# Patient Record
Sex: Female | Born: 1975 | Race: Black or African American | Hispanic: No | Marital: Single | Smoking: Never smoker
Health system: Southern US, Community
[De-identification: ages and names within clinical notes are randomized; demographics above are authoritative.]

## PROBLEM LIST (undated history)

## (undated) DIAGNOSIS — E785 Hyperlipidemia, unspecified: Secondary | ICD-10-CM

## (undated) DIAGNOSIS — R87619 Unspecified abnormal cytological findings in specimens from cervix uteri: Secondary | ICD-10-CM

## (undated) DIAGNOSIS — D649 Anemia, unspecified: Secondary | ICD-10-CM

## (undated) DIAGNOSIS — N921 Excessive and frequent menstruation with irregular cycle: Secondary | ICD-10-CM

## (undated) DIAGNOSIS — M1990 Unspecified osteoarthritis, unspecified site: Secondary | ICD-10-CM

## (undated) DIAGNOSIS — I10 Essential (primary) hypertension: Secondary | ICD-10-CM

## (undated) HISTORY — PX: DILATION AND CURETTAGE OF UTERUS: SHX78

## (undated) HISTORY — DX: Hyperlipidemia, unspecified: E78.5

## (undated) HISTORY — PX: FOOT SURGERY: SHX648

## (undated) HISTORY — DX: Unspecified abnormal cytological findings in specimens from cervix uteri: R87.619

## (undated) HISTORY — DX: Unspecified osteoarthritis, unspecified site: M19.90

## (undated) HISTORY — PX: ABDOMINAL HYSTERECTOMY: SHX81

## (undated) HISTORY — DX: Essential (primary) hypertension: I10

---
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
## 2000-08-08 ENCOUNTER — Encounter: Payer: Self-pay | Admitting: Emergency Medicine

## 2000-08-09 ENCOUNTER — Ambulatory Visit (HOSPITAL_COMMUNITY): Admission: RE | Admit: 2000-08-09 | Discharge: 2000-08-09 | Payer: Self-pay | Admitting: Obstetrics and Gynecology

## 2001-04-16 ENCOUNTER — Other Ambulatory Visit: Admission: RE | Admit: 2001-04-16 | Discharge: 2001-04-16 | Payer: Self-pay | Admitting: Gynecology

## 2001-11-07 ENCOUNTER — Inpatient Hospital Stay (HOSPITAL_COMMUNITY): Admission: AD | Admit: 2001-11-07 | Discharge: 2001-11-09 | Payer: Self-pay | Admitting: Gynecology

## 2001-12-24 ENCOUNTER — Other Ambulatory Visit: Admission: RE | Admit: 2001-12-24 | Discharge: 2001-12-24 | Payer: Self-pay | Admitting: Gynecology

## 2003-01-07 ENCOUNTER — Other Ambulatory Visit: Admission: RE | Admit: 2003-01-07 | Discharge: 2003-01-07 | Payer: Self-pay | Admitting: Gynecology

## 2010-04-13 DIAGNOSIS — R87619 Unspecified abnormal cytological findings in specimens from cervix uteri: Secondary | ICD-10-CM

## 2010-04-13 HISTORY — DX: Unspecified abnormal cytological findings in specimens from cervix uteri: R87.619

## 2010-05-14 HISTORY — PX: COLPOSCOPY: SHX161

## 2013-05-01 ENCOUNTER — Telehealth: Payer: Self-pay | Admitting: Certified Nurse Midwife

## 2013-05-01 NOTE — Telephone Encounter (Signed)
Patient dropped of a proof of aex for insurance. Needs by 05/11/13

## 2013-05-04 ENCOUNTER — Telehealth: Payer: Self-pay

## 2013-05-04 NOTE — Telephone Encounter (Signed)
Called patient to let her know that her medical form was filled out. Pt told me that i could fax the form & we can scan the original in her chart. I told pt we would do that.

## 2013-05-28 ENCOUNTER — Encounter: Payer: Self-pay | Admitting: Certified Nurse Midwife

## 2013-06-01 ENCOUNTER — Ambulatory Visit (INDEPENDENT_AMBULATORY_CARE_PROVIDER_SITE_OTHER): Payer: 59 | Admitting: Certified Nurse Midwife

## 2013-06-01 ENCOUNTER — Encounter: Payer: Self-pay | Admitting: Certified Nurse Midwife

## 2013-06-01 VITALS — BP 122/74 | HR 74 | Resp 16 | Ht 64.75 in | Wt 164.0 lb

## 2013-06-01 DIAGNOSIS — Z0000 Encounter for general adult medical examination without abnormal findings: Secondary | ICD-10-CM

## 2013-06-01 DIAGNOSIS — Z01419 Encounter for gynecological examination (general) (routine) without abnormal findings: Secondary | ICD-10-CM

## 2013-06-01 LAB — POCT URINALYSIS DIPSTICK
Bilirubin, UA: NEGATIVE
Blood, UA: NEGATIVE
Glucose, UA: NEGATIVE
Ketones, UA: NEGATIVE
Leukocytes, UA: NEGATIVE
Nitrite, UA: NEGATIVE
Protein, UA: NEGATIVE
Urobilinogen, UA: NEGATIVE
pH, UA: 5

## 2013-06-01 LAB — HEMOGLOBIN, FINGERSTICK: Hemoglobin, fingerstick: 12.1 g/dL (ref 12.0–16.0)

## 2013-06-01 NOTE — Progress Notes (Signed)
Reviewed personally.  M. Suzanne Rumor Sun, MD.  

## 2013-06-01 NOTE — Progress Notes (Signed)
38 y.o. [id number] Single African American Fe here for annual exam. Periods normal, no issues. Contraception working well. No partner change or STD screening needed. Sees PCP prn,no health issues in past year or today.  Patient's last menstrual period was 05/08/2013.          Sexually active: yes  The current method of family planning is condoms most of the time.    Exercising: some  exercise Smoker:  no  Health Maintenance: Pap:  05-28-12 neg HPV HR neg MMG:  05-21-11 neg & left breast u/s Colonoscopy:  none BMD:   none TDaP: 47yrs ago? Labs: Poct urine-neg, Hgb-12.1 Self breast exam: done monthly   reports that she has never smoked. She does not have any smokeless tobacco history on file. She reports that she does not drink alcohol or use illicit drugs.  Past Medical History  Diagnosis Date  . Abnormal Pap smear of cervix 12/11    LGSIL    Past Surgical History  Procedure Laterality Date  . Colposcopy  1/12    mild dysplasia CIN1, HPV effect  . Dilation and curettage of uterus      Current Outpatient Prescriptions  Medication Sig Dispense Refill  . Multiple Vitamins-Minerals (MULTIVITAMIN PO) Take by mouth daily.       No current facility-administered medications for this visit.    Family History  Problem Relation Age of Onset  . Thyroid disease Mother   . Hypertension Mother   . Diabetes Mother   . Sickle cell anemia Sister   . Deep vein thrombosis Sister     ROS:  Pertinent items are noted in HPI.  Otherwise, a comprehensive ROS was negative.  Exam:   BP 122/74  Pulse 74  Resp 16  Ht 5' 4.75" (1.645 m)  Wt 164 lb (74.39 kg)  BMI 27.49 kg/m2  LMP 05/08/2013 Height: 5' 4.75" (164.5 cm)  Ht Readings from Last 3 Encounters:  06/01/13 5' 4.75" (1.645 m)    General appearance: alert, cooperative and appears stated age Head: Normocephalic, without obvious abnormality, atraumatic Neck: no adenopathy, supple, symmetrical, trachea midline and thyroid normal to  inspection and palpation Lungs: clear to auscultation bilaterally Breasts: normal appearance, no masses or tenderness Heart: regular rate and rhythm Abdomen: soft, non-tender; no masses,  no organomegaly Extremities: extremities normal, atraumatic, no cyanosis or edema Skin: Skin color, texture, turgor normal. No rashes or lesions Lymph nodes: Cervical, supraclavicular, and axillary nodes normal. No abnormal inguinal nodes palpated Neurologic: Grossly normal   Pelvic: External genitalia:  no lesions              Urethra:  normal appearing urethra with no masses, tenderness or lesions              Bartholin's and Skene's: normal                 Vagina: normal appearing vagina with normal color and discharge, no lesions              Cervix: non tender, normal appearance              Pap taken: no Bimanual Exam:  Uterus:  normal size, contour, position, consistency, mobility, non-tender and anteflexed              Adnexa: normal adnexa and no mass, fullness, tenderness               Rectovaginal: Confirms  Anus:  normal sphincter tone, no lesions  A:  Well Woman with normal exam  Contraception condoms  Fasting labs  History of LSIL, colpo with CIN 1 diagnosis 2012 pap smear negative past 2 years with HPVHR not detected  Immunization update  P:   Reviewed health and wellness pertinent to exam  Labs: Lipid panel, TSH, Hgb A1-c  Declines today, not sure due  Pap smear as per guidelines   Mammogram at 39 pap smear not taken today counseled on breast self exam, mammography screening, STD prevention, HIV risk factors and prevention, adequate intake of calcium and vitamin D, diet and exercise  return annually or prn  An After Visit Summary was printed and given to the patient.

## 2013-06-01 NOTE — Patient Instructions (Signed)

## 2013-06-02 LAB — LIPID PANEL
Cholesterol: 189 mg/dL (ref 0–200)
HDL: 37 mg/dL — ABNORMAL LOW (ref >39)
LDL Cholesterol: 132 mg/dL — ABNORMAL HIGH (ref 0–99)
Total CHOL/HDL Ratio: 5.1 Ratio
Triglycerides: 99 mg/dL (ref <150)
VLDL: 20 mg/dL (ref 0–40)

## 2013-06-02 LAB — HEMOGLOBIN A1C
Hgb A1c MFr Bld: 5.3 % (ref <5.7)
Mean Plasma Glucose: 105 mg/dL (ref <117)

## 2013-06-02 LAB — TSH: TSH: 1.737 u[IU]/mL (ref 0.350–4.500)

## 2014-03-15 ENCOUNTER — Encounter: Payer: Self-pay | Admitting: Certified Nurse Midwife

## 2014-06-02 ENCOUNTER — Encounter: Payer: Self-pay | Admitting: Certified Nurse Midwife

## 2014-06-02 ENCOUNTER — Ambulatory Visit (INDEPENDENT_AMBULATORY_CARE_PROVIDER_SITE_OTHER): Payer: 59 | Admitting: Certified Nurse Midwife

## 2014-06-02 VITALS — BP 122/72 | HR 70 | Resp 16 | Ht 64.75 in | Wt 163.0 lb

## 2014-06-02 DIAGNOSIS — Z124 Encounter for screening for malignant neoplasm of cervix: Secondary | ICD-10-CM

## 2014-06-02 DIAGNOSIS — R899 Unspecified abnormal finding in specimens from other organs, systems and tissues: Secondary | ICD-10-CM

## 2014-06-02 DIAGNOSIS — Z0000 Encounter for general adult medical examination without abnormal findings: Secondary | ICD-10-CM

## 2014-06-02 DIAGNOSIS — Z01419 Encounter for gynecological examination (general) (routine) without abnormal findings: Secondary | ICD-10-CM | POA: Diagnosis not present

## 2014-06-02 DIAGNOSIS — N949 Unspecified condition associated with female genital organs and menstrual cycle: Secondary | ICD-10-CM | POA: Diagnosis not present

## 2014-06-02 DIAGNOSIS — Z23 Encounter for immunization: Secondary | ICD-10-CM

## 2014-06-02 DIAGNOSIS — N9489 Other specified conditions associated with female genital organs and menstrual cycle: Secondary | ICD-10-CM

## 2014-06-02 DIAGNOSIS — N852 Hypertrophy of uterus: Secondary | ICD-10-CM | POA: Diagnosis not present

## 2014-06-02 LAB — COMPREHENSIVE METABOLIC PANEL
ALBUMIN: 4.1 g/dL (ref 3.5–5.2)
ALT: 11 U/L (ref 0–35)
AST: 14 U/L (ref 0–37)
Alkaline Phosphatase: 34 U/L — ABNORMAL LOW (ref 39–117)
BUN: 12 mg/dL (ref 6–23)
CHLORIDE: 104 meq/L (ref 96–112)
CO2: 28 meq/L (ref 19–32)
Calcium: 9.4 mg/dL (ref 8.4–10.5)
Creat: 0.82 mg/dL (ref 0.50–1.10)
GLUCOSE: 94 mg/dL (ref 70–99)
POTASSIUM: 4.4 meq/L (ref 3.5–5.3)
SODIUM: 138 meq/L (ref 135–145)
TOTAL PROTEIN: 7.6 g/dL (ref 6.0–8.3)
Total Bilirubin: 1.6 mg/dL — ABNORMAL HIGH (ref 0.2–1.2)

## 2014-06-02 LAB — LIPID PANEL
Cholesterol: 194 mg/dL (ref 0–200)
HDL: 41 mg/dL (ref >39)
LDL Cholesterol: 132 mg/dL — ABNORMAL HIGH (ref 0–99)
Total CHOL/HDL Ratio: 4.7 Ratio
Triglycerides: 104 mg/dL (ref <150)
VLDL: 21 mg/dL (ref 0–40)

## 2014-06-02 LAB — POCT URINALYSIS DIPSTICK
Bilirubin, UA: NEGATIVE
Blood, UA: NEGATIVE
Glucose, UA: NEGATIVE
Ketones, UA: NEGATIVE
Leukocytes, UA: NEGATIVE
Nitrite, UA: NEGATIVE
Protein, UA: NEGATIVE
Urobilinogen, UA: NEGATIVE
pH, UA: 5

## 2014-06-02 LAB — HEMOGLOBIN, FINGERSTICK: Hemoglobin, fingerstick: 11.7 g/dL — ABNORMAL LOW (ref 12.0–16.0)

## 2014-06-02 LAB — HEMOGLOBIN A1C
Hgb A1c MFr Bld: 5.5 % (ref <5.7)
Mean Plasma Glucose: 111 mg/dL (ref <117)

## 2014-06-02 NOTE — Progress Notes (Signed)
39 y.o. [id number] Single  African American Fe here for annual exam. Periods normal, no missed periods. No partner change. No STD screening needed. Mother passed in 11/15. Emotionally coping well. Good family support. Sees urgent care if needed. Would like fasting labs today for insurance. No other health issues.  Patient's last menstrual period was 05/11/2014.          Sexually active: Yes.    The current method of family planning is condoms all the time.    Exercising: No.  exercise Smoker:  no  Health Maintenance: Pap:  05-28-12 neg HPV HR neg MMG:  05-21-11 neg & left breast u/s Colonoscopy:  none BMD:   none TDaP:  unsure Labs: Poct urine-neg, Hgb-11.7 Self breast exam: done occ   reports that she has never smoked. She does not have any smokeless tobacco history on file. She reports that she does not drink alcohol or use illicit drugs.  Past Medical History  Diagnosis Date  . Abnormal Pap smear of cervix 12/11    LGSIL    Past Surgical History  Procedure Laterality Date  . Colposcopy  1/12    mild dysplasia CIN1, HPV effect  . Dilation and curettage of uterus      Current Outpatient Prescriptions  Medication Sig Dispense Refill  . Multiple Vitamins-Minerals (MULTIVITAMIN PO) Take by mouth daily.     No current facility-administered medications for this visit.    Family History  Problem Relation Age of Onset  . Thyroid disease Mother   . Hypertension Mother   . Diabetes Mother   . Sickle cell anemia Sister   . Deep vein thrombosis Sister     ROS:  Pertinent items are noted in HPI.  Otherwise, a comprehensive ROS was negative.  Exam:   BP 122/72 mmHg  Pulse 70  Resp 16  Ht 5' 4.75" (1.645 m)  Wt 163 lb (73.936 kg)  BMI 27.32 kg/m2  LMP 05/11/2014 Height: 5' 4.75" (164.5 cm) Ht Readings from Last 3 Encounters:  06/02/14 5' 4.75" (1.645 m)  06/01/13 5' 4.75" (1.645 m)    General appearance: alert, cooperative and appears stated age Head: Normocephalic,  without obvious abnormality, atraumatic Neck: no adenopathy, supple, symmetrical, trachea midline and thyroid normal to inspection and palpation Lungs: clear to auscultation bilaterally Breasts: normal appearance, no masses or tenderness, No nipple retraction or dimpling, No nipple discharge or bleeding, No axillary or supraclavicular adenopathy Heart: regular rate and rhythm Abdomen: soft, non-tender; no masses,  no organomegaly Extremities: extremities normal, atraumatic, no cyanosis or edema Skin: Skin color, texture, turgor normal. No rashes or lesions Lymph nodes: Cervical, supraclavicular, and axillary nodes normal. No abnormal inguinal nodes palpated Neurologic: Grossly normal   Pelvic: External genitalia:  no lesions              Urethra:  normal appearing urethra with no masses, tenderness or lesions              Bartholin's and Skene's: normal                 Vagina: normal appearing vagina with normal color and discharge, no lesions              Cervix: normal, non tender, no lesions              Pap taken: No. Bimanual Exam:  Uterus:  enlarged, 12-14  weeks size and firm              Adnexa:  normal adnexa and no mass, fullness, tenderness on right, left not palpable or ? Adnexal mass due to fullness in that area               Rectovaginal: Confirms               Anus:  normal sphincter tone, no lesions    A:  Well Woman with normal exam  Contraception condoms  Enlarged uterus ? Left adnexal mass  Screening labs  Immunization update  P:   Reviewed health and wellness pertinent to exam  Discussed finding and need for evaluation with PUS. Discussed possible etiology of fibroid or adenomyosis or mass. Patient instructed she will be called with insurance information and then scheduled. Questions addressed at length.  Labs: Lipid panel, CMP, Hgb A1-c  Requests TDAP  Pap smear not taken today  counseled on breast self exam, mammography screening to schedule at age 75, STD  prevention, HIV risk factors and prevention, adequate intake of calcium and vitamin D, diet and exercise  return annually or prn    15 minutes spent with patient in face to face counseling regarding enlarged uterus and ? Left adnexal mass.   An After Visit Summary was printed and given to the patient.

## 2014-06-02 NOTE — Patient Instructions (Signed)

## 2014-06-06 NOTE — Progress Notes (Signed)
Reviewed personally.  M. Suzanne Furqan Gosselin, MD.  

## 2014-06-07 ENCOUNTER — Telehealth: Payer: Self-pay | Admitting: Certified Nurse Midwife

## 2014-06-07 NOTE — Telephone Encounter (Signed)
Call to patient to schedule PUS.  Advised of [place] $478.77. Patient in the mall. Will call back to discuss scheduling.

## 2014-06-08 NOTE — Telephone Encounter (Signed)
Patient call to speak with [place]

## 2014-06-09 ENCOUNTER — Other Ambulatory Visit: Payer: Self-pay

## 2014-06-09 DIAGNOSIS — N852 Hypertrophy of uterus: Secondary | ICD-10-CM

## 2014-06-09 LAB — IPS PAP TEST WITH REFLEX TO HPV

## 2014-06-09 NOTE — Telephone Encounter (Signed)
Call to patient. Scheduled PUS.  Reiterated PR $478.77.Marland KitchenMarland Kitchenpatient agreeable. Advised patient of 72 hour cancellation policy and $034 cancellation fee. Patient agreeable.

## 2014-06-16 ENCOUNTER — Other Ambulatory Visit: Payer: 59

## 2014-06-16 LAB — BILIRUBIN, TOTAL: BILIRUBIN TOTAL: 1.8 mg/dL — AB (ref 0.2–1.2)

## 2014-06-16 LAB — HEPATITIS C ANTIBODY: HCV AB: NEGATIVE

## 2014-06-16 LAB — HEPATITIS B CORE ANTIBODY, TOTAL: Hep B Core Total Ab: NONREACTIVE

## 2014-06-24 ENCOUNTER — Ambulatory Visit (INDEPENDENT_AMBULATORY_CARE_PROVIDER_SITE_OTHER): Payer: 59 | Admitting: Obstetrics & Gynecology

## 2014-06-24 ENCOUNTER — Ambulatory Visit (INDEPENDENT_AMBULATORY_CARE_PROVIDER_SITE_OTHER): Payer: 59

## 2014-06-24 VITALS — BP 126/82 | Ht 64.75 in | Wt 162.0 lb

## 2014-06-24 DIAGNOSIS — D251 Intramural leiomyoma of uterus: Secondary | ICD-10-CM

## 2014-06-24 DIAGNOSIS — D259 Leiomyoma of uterus, unspecified: Secondary | ICD-10-CM

## 2014-06-24 DIAGNOSIS — N852 Hypertrophy of uterus: Secondary | ICD-10-CM

## 2014-06-24 DIAGNOSIS — D250 Submucous leiomyoma of uterus: Secondary | ICD-10-CM

## 2014-06-24 NOTE — Progress Notes (Signed)
Follow-up six month pelvic ultrasound scheduled for 12-23-14 at 1:00pm. Patient agreeable to date and time.

## 2014-06-24 NOTE — Progress Notes (Signed)
39 y.o.Singlefemale here for a pelvic ultrasound due to enlarged uterus noted on physical exam on 06/02/14.  Pt's cycles are normal.  She is not on any hormonal medications, including OCPs, at this time.  Pt has never been told she has fibroids.  Reports she does not have heavy cycles and doesn't typically have much cramping or clots.  Patient's last menstrual period was 05/11/2014.  Sexually active:  yes  Contraception: condoms  FINDINGS: UTERUS: 12 x 7.5 x 9.1cm with 3.0cm, 2.4cm, 6.0cm, 5.4cm, 1.8cm and 1.9cm fibroids, both intramural and subserosla EMS: 10-28mm ADNEXA:  Left ovary 2.9 x 1.7 x 1.8cm   Right ovary 3.9 x 2.9 x 9.4IA with 2.2 cm follicle CUL DE SAC: no free fluid  Reviewed images with pt.  Endometrium is distorted by fibroids but pt's cycles are completely normal.  Do not feel endometrial biopsy is warranted.  As pt was unaware of hx of fibroids, information provided regarding symptoms, treatment options.  Reviewed these with her today.  Pt not interested in proceeding with anything right now as she is not having any problems.  As pt was seen a year ago without this finding noted, feel it is prudent to repeat PUS in six months to see if there is any significant change to fibroids.  Pt in agreement with plan.  Pt aware if she has any changes in bleeding, any mid cycle spotting, any increased pelvic pain and/or pressure, then she needs to call.  She voices clear understanding.  Assessment:  Enlarged fibroid uterus, normal cycles Plan: Repeat PUS 6 months to recheck fibroids.  Pt knows to call with any reason above.  ~20 minutes spent with patient >50% of time was in face to face discussion of above.

## 2014-06-30 ENCOUNTER — Encounter: Payer: Self-pay | Admitting: Obstetrics & Gynecology

## 2014-06-30 DIAGNOSIS — D250 Submucous leiomyoma of uterus: Secondary | ICD-10-CM | POA: Insufficient documentation

## 2014-06-30 DIAGNOSIS — D251 Intramural leiomyoma of uterus: Secondary | ICD-10-CM | POA: Insufficient documentation

## 2014-06-30 DIAGNOSIS — D259 Leiomyoma of uterus, unspecified: Secondary | ICD-10-CM | POA: Insufficient documentation

## 2014-06-30 DIAGNOSIS — N852 Hypertrophy of uterus: Secondary | ICD-10-CM | POA: Insufficient documentation

## 2014-12-21 ENCOUNTER — Telehealth: Payer: Self-pay | Admitting: Obstetrics & Gynecology

## 2014-12-21 NOTE — Telephone Encounter (Signed)
Attempted call to patient to review benefit. First attempt phone rang twice and then began to beep multiple times. No ability to leave voicemail or verify patient. Made second attempt to verify number was correct and received same rings and beeps. Unable to leave message with benefit. No one listed on DPR to have access to information. Benefit listed on appointment.

## 2014-12-22 NOTE — Telephone Encounter (Signed)
Spoke with patient. Reviewed benefits. Answered benefit related questions. Reminded benefits are an estimate. Patient agreeable and verified appointment date/time. Ok to close

## 2014-12-22 NOTE — Telephone Encounter (Signed)
Attempted second call to review benefits. Left voicemail to return call.

## 2014-12-22 NOTE — Telephone Encounter (Signed)
Spoke with patient. Advised there are no current appointment for PUS later in the day on 8/11. Offered to move to a later time on 8/18 but patient declines due to work schedule. Patient will keep appointment as scheduled.

## 2014-12-22 NOTE — Telephone Encounter (Signed)
Patient has a pus appointment 12/23/14 @ 1:00pm and consult @1 :30pm with Dr.Miller. Patient is asking if she could come in at a later time the same day?

## 2014-12-23 ENCOUNTER — Telehealth: Payer: Self-pay | Admitting: Obstetrics & Gynecology

## 2014-12-23 ENCOUNTER — Other Ambulatory Visit: Payer: 59

## 2014-12-23 ENCOUNTER — Other Ambulatory Visit: Payer: 59 | Admitting: Obstetrics & Gynecology

## 2014-12-23 NOTE — Telephone Encounter (Signed)
Patient called and left a message during lunch on the answering machine cancelling her PUS for today.

## 2014-12-23 NOTE — Telephone Encounter (Signed)
Patient called in to office to reschedule [facility] appt for today 12/23/14 at 1:00. Explained to patient that a $150.00 fee would apply to this missed procedure appointment. Patient upset with this. Patient asked for me to "hurry it up" rescheduling her appointment. I advised it would take a few minutes to reschedule this type of appointment and if she would like to call back later today when she had more time, that would be ok. Patient disconnected call.   Routing to provider for final review.   Encounter closed.

## 2014-12-23 NOTE — Telephone Encounter (Signed)
She's already talked to Seth Bake this morning.  Thanks.  Encounter can be closed.

## 2014-12-30 ENCOUNTER — Telehealth: Payer: Self-pay | Admitting: Obstetrics & Gynecology

## 2014-12-30 NOTE — Telephone Encounter (Signed)
Patient calling to reschedule ultrasound appointment.

## 2014-12-30 NOTE — Telephone Encounter (Signed)
Left message to call Kaitlyn at [phone]. 

## 2014-12-30 NOTE — Telephone Encounter (Signed)
Spoke with patient. Patient would like to reschedule PUS with Dr.Miller at this time. Requesting a Tuesday appointment. PUS rescheduled for 01/04/2015 at 3pm with 3:30pm consult with Dr.Miller. Patient is agreeable to date and time.  Routing to provider for final review. Patient agreeable to disposition. Will close encounter.   Patient aware provider will review message and nurse will return call if any additional advice or change of disposition.

## 2015-01-03 ENCOUNTER — Telehealth: Payer: Self-pay | Admitting: Obstetrics & Gynecology

## 2015-01-03 NOTE — Telephone Encounter (Signed)
Called patient regarding benefits and rescheduling PUS appointment. Patient is aware of her benefit. Patient states she is unsure if she is able to reschedule PUS for 01/06/15. Patient states she needs to check her work schedule and return call to verify.

## 2015-01-03 NOTE — Telephone Encounter (Signed)
Patient is returning a call to Becky. °

## 2015-01-04 ENCOUNTER — Other Ambulatory Visit: Payer: 59 | Admitting: Obstetrics & Gynecology

## 2015-01-04 ENCOUNTER — Other Ambulatory Visit: Payer: 59

## 2015-01-05 NOTE — Telephone Encounter (Signed)
Appointment scheduled for 01-06-15. Routing to provider for final review. Will close encounter.

## 2015-01-06 ENCOUNTER — Encounter: Payer: Self-pay | Admitting: Obstetrics & Gynecology

## 2015-01-06 ENCOUNTER — Ambulatory Visit (INDEPENDENT_AMBULATORY_CARE_PROVIDER_SITE_OTHER): Payer: 59

## 2015-01-06 ENCOUNTER — Ambulatory Visit (INDEPENDENT_AMBULATORY_CARE_PROVIDER_SITE_OTHER): Payer: 59 | Admitting: Obstetrics & Gynecology

## 2015-01-06 VITALS — BP 122/82 | HR 64 | Resp 16 | Wt 165.0 lb

## 2015-01-06 DIAGNOSIS — D251 Intramural leiomyoma of uterus: Secondary | ICD-10-CM | POA: Diagnosis not present

## 2015-01-06 DIAGNOSIS — N852 Hypertrophy of uterus: Secondary | ICD-10-CM | POA: Diagnosis not present

## 2015-01-06 DIAGNOSIS — D250 Submucous leiomyoma of uterus: Secondary | ICD-10-CM | POA: Diagnosis not present

## 2015-01-06 DIAGNOSIS — D259 Leiomyoma of uterus, unspecified: Secondary | ICD-10-CM | POA: Diagnosis not present

## 2015-01-06 NOTE — Progress Notes (Signed)
39 y.o. [id number] Single AA female here for a pelvic ultrasound to recheck uterine size and fibroids.  Pt noted to have enlarged uterus at AEX with Debbi Hollice Espy 06/02/14.  She came for ultrasound on 06/24/14.  Findings were discussed.  Cycles are regular.  She does have a couple of heavy days but this is stable over the last several years.  Pt reports cycles are about the same.  No new issues.    Patient's last menstrual period was 12/22/2014 (exact date).  Sexually active:  yes  Contraception: condoms  FINDINGS: UTERUS: 11.6 x 9 x 8.2cm with multiple fibroids (eight measured today) with largest two measuring 6.1cm and 4.5cm.  The uterus and largest fibroid are unchanged in size. EMS: 7.66mm, distorted ADNEXA:   Left ovary 2.5 x 2.0 x 3.0cm   Right ovary 3.0 x 2.8 x 2.0cm CUL DE SAC: no free fluid  Findings reviewed with pt.  ACOG bulletin on fibroids reviewed with pt.  She is not interested in any treatment at this time.  As long as cycles don't really change and uterus is stable on physical exam, do not feel repeat PUS is necessary.  Pt will call if has any changes but otherwise will return for AEX.  Assessment:  Enlarged uterus, multiple fibroids with largest 6.1cm Plan: Return for AEX.  Will repeat PUS if there is change on physical exam or if there is a change in bleeding profile with cycles worsening.  All questions answered.  ~15 minutes spent with patient >50% of time was in face to face discussion of above.

## 2015-06-08 ENCOUNTER — Telehealth: Payer: Self-pay | Admitting: Certified Nurse Midwife

## 2015-06-08 ENCOUNTER — Ambulatory Visit: Payer: 59 | Admitting: Certified Nurse Midwife

## 2015-06-08 NOTE — Telephone Encounter (Signed)
Patient cancel appointment for today due to heavy cycle. She is rescheduled to 06/15/15@1 :54 with DL

## 2015-06-15 ENCOUNTER — Encounter: Payer: Self-pay | Admitting: Certified Nurse Midwife

## 2015-06-15 ENCOUNTER — Ambulatory Visit (INDEPENDENT_AMBULATORY_CARE_PROVIDER_SITE_OTHER): Payer: 59 | Admitting: Certified Nurse Midwife

## 2015-06-15 VITALS — BP 124/76 | HR 70 | Resp 16 | Ht 64.75 in | Wt 163.0 lb

## 2015-06-15 DIAGNOSIS — Z01419 Encounter for gynecological examination (general) (routine) without abnormal findings: Secondary | ICD-10-CM

## 2015-06-15 DIAGNOSIS — Z0000 Encounter for general adult medical examination without abnormal findings: Secondary | ICD-10-CM

## 2015-06-15 DIAGNOSIS — Z8742 Personal history of other diseases of the female genital tract: Secondary | ICD-10-CM | POA: Diagnosis not present

## 2015-06-15 DIAGNOSIS — Z86018 Personal history of other benign neoplasm: Secondary | ICD-10-CM

## 2015-06-15 LAB — CBC
HCT: 34.9 % — ABNORMAL LOW (ref 36.0–46.0)
Hemoglobin: 11.3 g/dL — ABNORMAL LOW (ref 12.0–15.0)
MCH: 27.4 pg (ref 26.0–34.0)
MCHC: 32.4 g/dL (ref 30.0–36.0)
MCV: 84.7 fL (ref 78.0–100.0)
MPV: 9.7 fL (ref 8.6–12.4)
Platelets: 366 10*3/uL (ref 150–400)
RBC: 4.12 MIL/uL (ref 3.87–5.11)
RDW: 14.4 % (ref 11.5–15.5)
WBC: 6.6 10*3/uL (ref 4.0–10.5)

## 2015-06-15 LAB — POCT URINALYSIS DIPSTICK
Bilirubin, UA: NEGATIVE
Blood, UA: NEGATIVE
Glucose, UA: NEGATIVE
KETONES UA: NEGATIVE
LEUKOCYTES UA: NEGATIVE
Nitrite, UA: NEGATIVE
PH UA: 5
PROTEIN UA: NEGATIVE
UROBILINOGEN UA: NEGATIVE

## 2015-06-15 LAB — COMPREHENSIVE METABOLIC PANEL WITH GFR
ALT: 10 U/L (ref 6–29)
AST: 13 U/L (ref 10–30)
Albumin: 4.2 g/dL (ref 3.6–5.1)
Alkaline Phosphatase: 31 U/L — ABNORMAL LOW (ref 33–115)
BUN: 12 mg/dL (ref 7–25)
CO2: 26 mmol/L (ref 20–31)
Calcium: 9 mg/dL (ref 8.6–10.2)
Chloride: 99 mmol/L (ref 98–110)
Creat: 0.87 mg/dL (ref 0.50–1.10)
Glucose, Bld: 83 mg/dL (ref 65–99)
Potassium: 4.4 mmol/L (ref 3.5–5.3)
Sodium: 138 mmol/L (ref 135–146)
Total Bilirubin: 1.5 mg/dL — ABNORMAL HIGH (ref 0.2–1.2)
Total Protein: 7.4 g/dL (ref 6.1–8.1)

## 2015-06-15 LAB — LIPID PANEL
CHOL/HDL RATIO: 5 ratio (ref <=5.0)
Cholesterol: 213 mg/dL — ABNORMAL HIGH (ref 125–200)
HDL: 43 mg/dL — ABNORMAL LOW (ref >=46)
LDL CALC: 146 mg/dL — AB (ref <130)
Triglycerides: 119 mg/dL (ref <150)
VLDL: 24 mg/dL (ref <30)

## 2015-06-15 LAB — IBC PANEL
%SAT: 20 % (ref 11–50)
TIBC: 335 ug/dL (ref 250–450)
UIBC: 268 ug/dL (ref 125–400)

## 2015-06-15 LAB — IRON: Iron: 67 ug/dL (ref 40–190)

## 2015-06-15 LAB — FERRITIN: Ferritin: 7 ng/mL — ABNORMAL LOW (ref 10–291)

## 2015-06-15 NOTE — Progress Notes (Signed)
40 y.o. [id number] Single  African American Fe here for annual exam. Periods normal, occasionally heavy, but not a problem. Sexually active, no partner change. No STD screening. Known fibroids no issues with, does not feel size has changed. Request screening labs. Sees urgent care if needed. No other health issues today.  Patient's last menstrual period was 06/07/2015.          Sexually active: Yes.    The current method of family planning is condoms most of the time.    Exercising: No.  exercise Smoker:  no  Health Maintenance: Pap:  06-02-14 neg MMG:  05-21-11 neg & left breast u/s Colonoscopy:  none BMD:   none TDaP:  2016 Shingles: no Pneumonia: no Hep C and HIV: Hep C neg 2016 Labs: Poct urine-neg, Hgb-10.8 Self breast exam: done monthly   reports that she has never smoked. She does not have any smokeless tobacco history on file. She reports that she does not drink alcohol or use illicit drugs.  Past Medical History  Diagnosis Date  . Abnormal Pap smear of cervix 12/11    LGSIL    Past Surgical History  Procedure Laterality Date  . Colposcopy  1/12    mild dysplasia CIN1, HPV effect  . Dilation and curettage of uterus      Current Outpatient Prescriptions  Medication Sig Dispense Refill  . Multiple Vitamins-Minerals (MULTIVITAMIN PO) Take by mouth daily.     No current facility-administered medications for this visit.    Family History  Problem Relation Age of Onset  . Thyroid disease Mother   . Hypertension Mother   . Diabetes Mother   . Heart attack Mother   . Sickle cell anemia Sister   . Deep vein thrombosis Sister     ROS:  Pertinent items are noted in HPI.  Otherwise, a comprehensive ROS was negative.  Exam:   BP 124/76 mmHg  Pulse 70  Resp 16  Ht 5' 4.75" (1.645 m)  Wt 163 lb (73.936 kg)  BMI 27.32 kg/m2  LMP 06/07/2015 Height: 5' 4.75" (164.5 cm) Ht Readings from Last 3 Encounters:  06/15/15 5' 4.75" (1.645 m)  06/24/14 5' 4.75" (1.645 m)   06/02/14 5' 4.75" (1.645 m)    General appearance: alert, cooperative and appears stated age Head: Normocephalic, without obvious abnormality, atraumatic Neck: no adenopathy, supple, symmetrical, trachea midline and thyroid normal to inspection and palpation Lungs: clear to auscultation bilaterally Breasts: normal appearance, no masses or tenderness, No nipple retraction or dimpling, No nipple discharge or bleeding, No axillary or supraclavicular adenopathy Heart: regular rate and rhythm Abdomen: soft, non-tender; no masses,  no organomegaly Extremities: extremities normal, atraumatic, no cyanosis or edema Skin: Skin color, texture, turgor normal. No rashes or lesions Lymph nodes: Cervical, supraclavicular, and axillary nodes normal. No abnormal inguinal nodes palpated Neurologic: Grossly normal   Pelvic: External genitalia:  no lesions              Urethra:  normal appearing urethra with no masses, tenderness or lesions              Bartholin's and Skene's: normal                 Vagina: normal appearing vagina with normal color and discharge, no lesions              Cervix: normal appearance,non tender, no lesions              Pap taken: No. Bimanual Exam:  Uterus:  enlarged, 12-14 week size known fibroids,no size change from previous exam weeks size              Adnexa: no mass, fullness, tenderness and normal adnexa               Rectovaginal: Confirms               Anus:  normal sphincter tone, no lesions  Chaperone present: yes  A:  Well Woman with normal exam  Contraception condoms  Enlarged uterus due to known fibroids,  No size change in exam  R/O anemia  Screening labs  P:   Reviewed health and wellness pertinent to exam  Discussed cycle changes can occur with fibroids and need to advise if excessive bleeding or change.  Discussed iron foods to include in diet.  Labs: Iron, Ferritin, IBC, CBC, Lipid panel, CMP   Pap smear as above not taken   counseled on breast  self exam, mammography screening( given information to schedule), STD prevention, HIV risk factors and prevention, adequate intake of calcium and vitamin D, diet and exercise.  return annually or prn  An After Visit Summary was printed and given to the patient.

## 2015-06-15 NOTE — Progress Notes (Signed)
Reviewed personally.  M. Suzanne Quantasia Stegner, MD.  

## 2015-06-15 NOTE — Patient Instructions (Signed)

## 2015-06-16 LAB — HEMOGLOBIN, FINGERSTICK: Hemoglobin, fingerstick: 10.8 g/dL — ABNORMAL LOW (ref 12.0–16.0)

## 2016-06-19 ENCOUNTER — Ambulatory Visit: Payer: 59 | Admitting: Certified Nurse Midwife

## 2016-07-03 ENCOUNTER — Encounter: Payer: Self-pay | Admitting: Certified Nurse Midwife

## 2016-07-03 ENCOUNTER — Ambulatory Visit (INDEPENDENT_AMBULATORY_CARE_PROVIDER_SITE_OTHER): Payer: 59 | Admitting: Certified Nurse Midwife

## 2016-07-03 VITALS — BP 104/68 | HR 76 | Resp 20 | Ht 64.75 in | Wt 167.0 lb

## 2016-07-03 DIAGNOSIS — Z862 Personal history of diseases of the blood and blood-forming organs and certain disorders involving the immune mechanism: Secondary | ICD-10-CM

## 2016-07-03 DIAGNOSIS — N852 Hypertrophy of uterus: Secondary | ICD-10-CM | POA: Diagnosis not present

## 2016-07-03 DIAGNOSIS — Z124 Encounter for screening for malignant neoplasm of cervix: Secondary | ICD-10-CM

## 2016-07-03 DIAGNOSIS — Z86018 Personal history of other benign neoplasm: Secondary | ICD-10-CM

## 2016-07-03 DIAGNOSIS — Z01419 Encounter for gynecological examination (general) (routine) without abnormal findings: Secondary | ICD-10-CM

## 2016-07-03 DIAGNOSIS — N907 Vulvar cyst: Secondary | ICD-10-CM | POA: Diagnosis not present

## 2016-07-03 DIAGNOSIS — Z0000 Encounter for general adult medical examination without abnormal findings: Secondary | ICD-10-CM | POA: Diagnosis not present

## 2016-07-03 LAB — IBC PANEL
%SAT: 40 % (ref 11–50)
TIBC: 348 ug/dL (ref 250–450)
UIBC: 210 ug/dL (ref 125–400)

## 2016-07-03 LAB — COMPREHENSIVE METABOLIC PANEL
ALK PHOS: 29 U/L — AB (ref 33–115)
ALT: 11 U/L (ref 6–29)
AST: 14 U/L (ref 10–30)
Albumin: 4.1 g/dL (ref 3.6–5.1)
BILIRUBIN TOTAL: 1.2 mg/dL (ref 0.2–1.2)
BUN: 10 mg/dL (ref 7–25)
CO2: 25 mmol/L (ref 20–31)
CREATININE: 0.84 mg/dL (ref 0.50–1.10)
Calcium: 9.3 mg/dL (ref 8.6–10.2)
Chloride: 103 mmol/L (ref 98–110)
Glucose, Bld: 86 mg/dL (ref 65–99)
Potassium: 4 mmol/L (ref 3.5–5.3)
SODIUM: 135 mmol/L (ref 135–146)
TOTAL PROTEIN: 7.4 g/dL (ref 6.1–8.1)

## 2016-07-03 LAB — CBC
HCT: 34.4 % — ABNORMAL LOW (ref 35.0–45.0)
Hemoglobin: 11.2 g/dL — ABNORMAL LOW (ref 11.7–15.5)
MCH: 27.3 pg (ref 27.0–33.0)
MCHC: 32.6 g/dL (ref 32.0–36.0)
MCV: 83.9 fL (ref 80.0–100.0)
MPV: 9.5 fL (ref 7.5–12.5)
PLATELETS: 329 10*3/uL (ref 140–400)
RBC: 4.1 MIL/uL (ref 3.80–5.10)
RDW: 14.4 % (ref 11.0–15.0)
WBC: 5.6 10*3/uL (ref 3.8–10.8)

## 2016-07-03 LAB — TSH: TSH: 1.8 m[IU]/L

## 2016-07-03 LAB — LIPID PANEL
Cholesterol: 219 mg/dL — ABNORMAL HIGH (ref <200)
HDL: 38 mg/dL — ABNORMAL LOW (ref >50)
LDL CALC: 149 mg/dL — AB (ref <100)
TRIGLYCERIDES: 160 mg/dL — AB (ref <150)
Total CHOL/HDL Ratio: 5.8 Ratio — ABNORMAL HIGH (ref <5.0)
VLDL: 32 mg/dL — ABNORMAL HIGH (ref <30)

## 2016-07-03 LAB — IRON: Iron: 138 ug/dL (ref 40–190)

## 2016-07-03 NOTE — Patient Instructions (Signed)

## 2016-07-03 NOTE — Progress Notes (Signed)
41 y.o. [id number] Single  African American Fe here for annual exam. Periods normal, no issues. Partner same,no STD concerns or screening needed. History of fibroids, does not feel size has changed. No symptom increase. Denies any urinary symptoms. Also noted small cyst on left labia about 2 weeks ago, non tender, no redness or drainage. No history of genital warts or shaving issues.  Contraception working well. Sees Urgent care if needed. Screening labs. Planning a cruise with sister this spring!  Patient's last menstrual period was 06/07/2016 (exact date).          Sexually active: Yes.    The current method of family planning is condoms all the time.    Exercising: Yes.    walking Smoker:  No Alcohol use: no  Health Maintenance: Pap:  06-02-14 neg MMG:  05-21-11 neg & left breast u/s Colonoscopy:  none BMD:   none TDaP:  2016 Shingles: no Pneumonia: no Hep C and HIV: Hep c neg 2016, HIV lab maybe done in 2015 Labs: none Self breast exam: done monthly   reports that she has never smoked. She has never used smokeless tobacco. She reports that she does not drink alcohol or use drugs.  Past Medical History:  Diagnosis Date  . Abnormal Pap smear of cervix 12/11   LGSIL    Past Surgical History:  Procedure Laterality Date  . COLPOSCOPY  1/12   mild dysplasia CIN1, HPV effect  . DILATION AND CURETTAGE OF UTERUS      Current Outpatient Prescriptions  Medication Sig Dispense Refill  . GARLIC PO Take by mouth as needed.    . Multiple Vitamins-Minerals (MULTIVITAMIN PO) Take by mouth daily.     No current facility-administered medications for this visit.     Family History  Problem Relation Age of Onset  . Thyroid disease Mother   . Hypertension Mother   . Diabetes Mother   . Heart attack Mother   . Sickle cell anemia Sister   . Deep vein thrombosis Sister     ROS:  Pertinent items are noted in HPI.  Otherwise, a comprehensive ROS was negative.  Exam:   BP 104/68   Pulse  76   Resp 20   Ht 5' 4.75" (1.645 m)   Wt 167 lb (75.8 kg)   LMP 06/07/2016 (Exact Date)   BMI 28.01 kg/m   Height: 5' 4.75" (164.5 cm) Ht Readings from Last 3 Encounters:  07/03/16 5' 4.75" (1.645 m)  06/15/15 5' 4.75" (1.645 m)  06/24/14 5' 4.75" (1.645 m)    General appearance: alert, cooperative and appears stated age Head: Normocephalic, without obvious abnormality, atraumatic Neck: no adenopathy, supple, symmetrical, trachea midline and thyroid normal to inspection and palpation Lungs: clear to auscultation bilaterally Breasts: normal appearance, no masses or tenderness, No nipple retraction or dimpling, No nipple discharge or bleeding, No axillary or supraclavicular adenopathy Heart: regular rate and rhythm Abdomen: soft, non-tender; no masses,  no organomegaly Extremities: extremities normal, atraumatic, no cyanosis or edema Skin: Skin color, texture, turgor normal. No rashes or lesions Lymph nodes: Cervical, supraclavicular, and axillary nodes normal. No abnormal inguinal nodes palpated Neurologic: Grossly normal   Pelvic: External genitalia:  no lesions              Urethra:  normal appearing urethra with no masses, tenderness or lesions              Bartholin's and Skene's: normal  Vagina: normal appearing vagina with normal color and discharge, no lesions              Cervix: no bleeding following Pap, no cervical motion tenderness and no lesions              Pap taken: Yes.   Bimanual Exam:  Uterus:  enlarged, 14-16 week size, nodular weeks size              Adnexnodular feelno mass, fullness, tenderness and unable to feel adnexa due to fibroids,                Rectovaginal: Confirms               Anus:  normal sphincter tone, no lesions  Chaperone present: yes  A:  Well Woman with normal exam  Contraception condoms  History of Enlarged uterus with 8 fibroids on las PUS in 2016, ? Size change  Left labial sebaceous cyst, no abscess  Screening  labs  P:   Reviewed health and wellness pertinent to exam  Discussed uterine findings and may need another PUS. Will recheck when in to recheck labial cyst. Agreeable  Discussed finding and use of epsom salt soak or tub bath to see if resolves. Warning signs discussed and need to advise if occurs. Will recheck in one month if needed, if not resolved. Questions addressed. Agreeable to plan.  Labs: CMP,Lipid panel, Iron, IBC, TSH, CBC, Vitamin D  Pap smear as above with HPVHR   counseled on breast self exam, mammography screening, STD prevention, HIV risk factors and prevention, adequate intake of calcium and vitamin D, diet and exercise  Recheck in one month aex one year  An After Visit Summary was printed and given to the patient.

## 2016-07-04 ENCOUNTER — Other Ambulatory Visit: Payer: Self-pay | Admitting: Certified Nurse Midwife

## 2016-07-04 DIAGNOSIS — R899 Unspecified abnormal finding in specimens from other organs, systems and tissues: Secondary | ICD-10-CM

## 2016-07-04 LAB — VITAMIN D 25 HYDROXY (VIT D DEFICIENCY, FRACTURES): Vit D, 25-Hydroxy: 25 ng/mL — ABNORMAL LOW (ref 30–100)

## 2016-07-04 NOTE — Progress Notes (Signed)
Encounter reviewed Jill Jertson, MD   

## 2016-07-05 ENCOUNTER — Telehealth: Payer: Self-pay

## 2016-07-05 LAB — IPS PAP TEST WITH HPV

## 2016-07-05 MED ORDER — VITAMIN D (ERGOCALCIFEROL) 1.25 MG (50000 UNIT) PO CAPS: 50000.0000 [IU] | ORAL_CAPSULE | ORAL | 0 refills | Status: DC

## 2016-07-05 NOTE — Telephone Encounter (Signed)
Prescription for Vitamin D sent to pharmacy per DL.

## 2016-07-05 NOTE — Telephone Encounter (Signed)
-----   Message from Regina Eck, CNM sent at 07/04/2016  4:40 PM EST ----- Notify patient that Vitamin D is low needs protocol TSH is normal Lipid panel is elevated, cholesterol 219, triglycerides 160, HDL is low and should be greater than 50 LDL is elevated also at 149. Patient needs to work on decrease in cholesterol foods and exercise. Needs recheck in 3 months fasting. Order placed please schedule Liver, kidney, glucose profile is normal Iron and saturation are normal CBC borderline low hgb, no concerns at this time

## 2016-07-31 ENCOUNTER — Ambulatory Visit: Payer: 59 | Admitting: Certified Nurse Midwife

## 2016-08-01 ENCOUNTER — Ambulatory Visit (INDEPENDENT_AMBULATORY_CARE_PROVIDER_SITE_OTHER): Payer: 59 | Admitting: Certified Nurse Midwife

## 2016-08-01 ENCOUNTER — Encounter: Payer: Self-pay | Admitting: Certified Nurse Midwife

## 2016-08-01 VITALS — BP 122/70 | HR 68 | Resp 16 | Ht 64.75 in | Wt 170.0 lb

## 2016-08-01 DIAGNOSIS — N9089 Other specified noninflammatory disorders of vulva and perineum: Secondary | ICD-10-CM | POA: Diagnosis not present

## 2016-08-01 NOTE — Progress Notes (Signed)
41 y.o. Single African American female [phone] here for follow up of left vulvar lesion noted on 07/03/16 at aex exam. Noted ingrown hair in it several months ago with no change in size since aex. Would like to have removed if possible. Patient also noted ? Soreness on lower left leg at the end of the day. She wears socks all the time, crew ones. ? Swelling please check. Denies numbness or shooting pain or injury. Onset past few weeks. No other concerns today.  O: Healthy WD,WN female Affect: normal Skin:warm and dry Left leg: normal color, movement, no edema noted, pulse equal and strong on both legs. Area of tenderness is under band of elastic with sock. No redness or edema noted or lesion. Not tender to touch. Inguinal lymph nodes not enlarged, non tender Pelvic exam:EXTERNAL GENITALIA: normal appearing vulva with no masses, tenderness or lesions on right, left vulva small 1 cm lesion noted unchanged since aex, non tender, with hair noted lesion.   A:Left vulva lesion,patient would like removed. Compression from elastic band of sock causing soreness on leg. No obvious injury or problem.   P: Discussed findings of same appearance since last visit and option of removal here in the office with local anesthetic. Patient would like to schedule for removal. Dr. Talbert Nan in to assess and agrees removal is possible. Discussed with patient she will be called with insurance info and scheduled for removal of lesion by Dr. Talbert Nan. Questions addressed.  Discussed normal leg findings with pulses and no edema noted. Discussed compression from socks can cause soreness. Advised to try different style that does  Not compress this area to see if resolves. If continues or increases needs to be seen by PCP or Urgent care. Questions addressed.   RV as above, prn

## 2016-08-05 NOTE — Progress Notes (Signed)
Encounter reviewed, patient seen. Will return for vulvar biopsy Sumner Boast, MD

## 2016-10-02 ENCOUNTER — Other Ambulatory Visit: Payer: Self-pay

## 2016-10-03 ENCOUNTER — Other Ambulatory Visit (INDEPENDENT_AMBULATORY_CARE_PROVIDER_SITE_OTHER): Payer: 59

## 2016-10-03 DIAGNOSIS — E559 Vitamin D deficiency, unspecified: Secondary | ICD-10-CM

## 2016-10-03 DIAGNOSIS — R899 Unspecified abnormal finding in specimens from other organs, systems and tissues: Secondary | ICD-10-CM

## 2016-10-03 LAB — LIPID PANEL
CHOLESTEROL: 204 mg/dL — AB (ref <200)
HDL: 41 mg/dL — ABNORMAL LOW (ref >50)
LDL Cholesterol: 142 mg/dL — ABNORMAL HIGH (ref <100)
TRIGLYCERIDES: 104 mg/dL (ref <150)
Total CHOL/HDL Ratio: 5 Ratio — ABNORMAL HIGH (ref <5.0)
VLDL: 21 mg/dL (ref <30)

## 2016-10-04 ENCOUNTER — Telehealth: Payer: Self-pay

## 2016-10-04 ENCOUNTER — Other Ambulatory Visit: Payer: Self-pay | Admitting: Certified Nurse Midwife

## 2016-10-04 DIAGNOSIS — R899 Unspecified abnormal finding in specimens from other organs, systems and tissues: Secondary | ICD-10-CM

## 2016-10-04 LAB — VITAMIN D 25 HYDROXY (VIT D DEFICIENCY, FRACTURES): Vit D, 25-Hydroxy: 32 ng/mL (ref 30–100)

## 2016-10-04 NOTE — Telephone Encounter (Signed)
Patient notified of results. See lab 

## 2016-10-04 NOTE — Telephone Encounter (Signed)
Patient is returning a call to Joy. °

## 2016-10-04 NOTE — Telephone Encounter (Signed)
lmtcb

## 2016-10-04 NOTE — Telephone Encounter (Signed)
-----   Message from Regina Eck, CNM sent at 10/04/2016  8:02 AM EDT ----- Notify patient that Vitamin D is now low normal range, can switch to OTC Vit D 3 1000 iu daily to maintain, send food list.  Recheck at aex Lipid panel some improvement over past 3 months, cholesterol now 204 from 219 Triglycerides now 104 from 160 HDl 41 from 38 Risk ratio 5.0 from 5.8 still elevated LDL 142 from 149 Recommend starting on Omega 3 [place] fish oil 1000 mg daily along with walking for 20 minutes daily to increase activity to encourage heart healthy. Continue to work on diet(weight loss)of lean meat, fish(not fried) lots of fresh vegetables, limited salt intake and limited carbohydrates(sweets) Recheck Lipid panel in 4 months please schedule, no order placed

## 2017-02-06 ENCOUNTER — Other Ambulatory Visit: Payer: 59

## 2017-02-14 ENCOUNTER — Other Ambulatory Visit: Payer: 59

## 2017-02-14 DIAGNOSIS — R899 Unspecified abnormal finding in specimens from other organs, systems and tissues: Secondary | ICD-10-CM

## 2017-02-15 LAB — LIPID PANEL
Chol/HDL Ratio: 5 ratio — ABNORMAL HIGH (ref 0.0–4.4)
Cholesterol, Total: 201 mg/dL — ABNORMAL HIGH (ref 100–199)
HDL: 40 mg/dL (ref >39)
LDL Calculated: 143 mg/dL — ABNORMAL HIGH (ref 0–99)
Triglycerides: 89 mg/dL (ref 0–149)
VLDL CHOLESTEROL CAL: 18 mg/dL (ref 5–40)

## 2017-02-18 ENCOUNTER — Telehealth: Payer: Self-pay | Admitting: *Deleted

## 2017-02-18 NOTE — Telephone Encounter (Signed)
Spoke with patient, advised of results and recommendations as seen below per Melvia Heaps, CNM. Patient declined assistance with scheduling f/u with PCP. Patient states she needs to see what providers are covered in her insurance network first, will return call to request lab results if needed. Patient verbalizes understanding. Aware to return call to office if assistance is needed with PCP scheduling.   Routing to provider for final review. Patient is agreeable to disposition. Will close encounter.

## 2017-02-18 NOTE — Telephone Encounter (Signed)
-----   Message from Regina Eck, CNM sent at 02/18/2017  7:52 AM EDT ----- Notify patient that cholesterol is down from 203 to 201,  Triglycerides are normal, HDL borderline normal at 40 LDL cholesterol is still elevated at 143.   She needs referral to PCP due to ratio still elevated even though improved. Continue to work on weight loss and exercise. Please refer.  Will see her for aex in 2019

## 2017-07-04 ENCOUNTER — Encounter: Payer: Self-pay | Admitting: Certified Nurse Midwife

## 2017-07-04 ENCOUNTER — Other Ambulatory Visit: Payer: Self-pay

## 2017-07-04 ENCOUNTER — Ambulatory Visit (INDEPENDENT_AMBULATORY_CARE_PROVIDER_SITE_OTHER): Payer: 59 | Admitting: Certified Nurse Midwife

## 2017-07-04 VITALS — BP 122/80 | HR 68 | Resp 16 | Ht 64.75 in | Wt 161.0 lb

## 2017-07-04 DIAGNOSIS — N852 Hypertrophy of uterus: Secondary | ICD-10-CM

## 2017-07-04 DIAGNOSIS — Z01419 Encounter for gynecological examination (general) (routine) without abnormal findings: Secondary | ICD-10-CM

## 2017-07-04 DIAGNOSIS — E663 Overweight: Secondary | ICD-10-CM | POA: Diagnosis not present

## 2017-07-04 DIAGNOSIS — Z86018 Personal history of other benign neoplasm: Secondary | ICD-10-CM | POA: Diagnosis not present

## 2017-07-04 DIAGNOSIS — E559 Vitamin D deficiency, unspecified: Secondary | ICD-10-CM | POA: Diagnosis not present

## 2017-07-04 DIAGNOSIS — Z0000 Encounter for general adult medical examination without abnormal findings: Secondary | ICD-10-CM | POA: Diagnosis not present

## 2017-07-04 NOTE — Patient Instructions (Signed)

## 2017-07-04 NOTE — Progress Notes (Signed)
42 y.o. [id number] Single  African American Fe here for annual exam. Periods normal, no issues. Condoms working well. No Partner change or STD's concerns.  No health issues today or in the last year.   Brought work form for labs screening and PE confirmation. Screening labs today. Planning vacation.  Patient's last menstrual period was 06/10/2017 (exact date).          Sexually active: Yes.    The current method of family planning is condoms all the time.    Exercising: Yes.    walking Smoker:  no  Health Maintenance: Pap:  07-03-16 neg HPV HR neg History of Abnormal Pap: yes MMG:  Done yesterday Self Breast exams: yes Colonoscopy:  none BMD:   none TDaP:  2016 Shingles: no Pneumonia: no Hep C and HIV: Hep c neg 2016, HIV maybe done in 2015 Labs: yes   reports that  has never smoked. she has never used smokeless tobacco. She reports that she does not drink alcohol or use drugs.  Past Medical History:  Diagnosis Date  . Abnormal Pap smear of cervix 12/11   LGSIL    Past Surgical History:  Procedure Laterality Date  . COLPOSCOPY  1/12   mild dysplasia CIN1, HPV effect  . DILATION AND CURETTAGE OF UTERUS      Current Outpatient Medications  Medication Sig Dispense Refill  . GARLIC PO Take by mouth as needed.    . Multiple Vitamins-Minerals (MULTIVITAMIN PO) Take by mouth daily.    . Vitamin D, Ergocalciferol, (DRISDOL) 50000 units CAPS capsule Take 1 capsule (50,000 Units total) by mouth every 7 (seven) days. (Patient not taking: Reported on 07/04/2017) 12 capsule 0   No current facility-administered medications for this visit.     Family History  Problem Relation Age of Onset  . Thyroid disease Mother   . Hypertension Mother   . Diabetes Mother   . Heart attack Mother   . Sickle cell anemia Sister   . Deep vein thrombosis Sister     ROS:  Pertinent items are noted in HPI.  Otherwise, a comprehensive ROS was negative.  Exam:   BP 122/80   Pulse 68   Resp 16   Ht  5' 4.75" (1.645 m)   Wt 161 lb (73 kg)   LMP 06/10/2017 (Exact Date)   BMI 27.00 kg/m  Height: 5' 4.75" (164.5 cm) Ht Readings from Last 3 Encounters:  07/04/17 5' 4.75" (1.645 m)  08/01/16 5' 4.75" (1.645 m)  07/03/16 5' 4.75" (1.645 m)    General appearance: alert, cooperative and appears stated age Head: Normocephalic, without obvious abnormality, atraumatic Neck: no adenopathy, supple, symmetrical, trachea midline and thyroid normal to inspection and palpation Lungs: clear to auscultation bilaterally Breasts: normal appearance, no masses or tenderness, No nipple retraction or dimpling, No nipple discharge or bleeding, No axillary or supraclavicular adenopathy Heart: regular rate and rhythm Abdomen: soft, non-tender; no masses,  no organomegaly Extremities: extremities normal, atraumatic, no cyanosis or edema Skin: Skin color, texture, turgor normal. No rashes or lesions Lymph nodes: Cervical, supraclavicular, and axillary nodes normal. No abnormal inguinal nodes palpated Neurologic: Grossly normal   Pelvic: External genitalia:  no lesions              Urethra:  normal appearing urethra with no masses, tenderness or lesions              Bartholin's and Skene's: normal  Vagina: normal appearing vagina with normal color and discharge, no lesions              Cervix: multiparous appearance, no cervical motion tenderness and no lesions              Pap taken: No. Bimanual Exam:  Uterus:  enlarged, 14-16 week size, no essential change weeks size known enlargement with fibroids              Adnexa: normal adnexa and no mass, fullness, tenderness               Rectovaginal: Confirms               Anus:  normal sphincter tone, no lesions  Chaperone present: yes  A:  Well Woman with normal exam  Contraception Condoms  History of fibroids with uterine enlargement, no size change  History of Vitamin D deficiency  Screening labs  P:   Reviewed health and wellness  pertinent to exam  Warning signs with fibroids given and need to advise  Labs: Lipid panel, Vitamin D, TSH, CMP,Hgb a1-c  Pap smear: no   counseled on breast self exam, mammography screening, STD prevention, HIV risk factors and prevention, adequate intake of calcium and vitamin D, diet and exercise  return annually or prn  An After Visit Summary was printed and given to the patient.

## 2017-07-05 ENCOUNTER — Other Ambulatory Visit: Payer: Self-pay | Admitting: Certified Nurse Midwife

## 2017-07-05 DIAGNOSIS — E559 Vitamin D deficiency, unspecified: Secondary | ICD-10-CM

## 2017-07-05 LAB — TSH: TSH: 2.44 u[IU]/mL (ref 0.450–4.500)

## 2017-07-05 LAB — LIPID PANEL
Chol/HDL Ratio: 4.6 ratio — ABNORMAL HIGH (ref 0.0–4.4)
Cholesterol, Total: 203 mg/dL — ABNORMAL HIGH (ref 100–199)
HDL: 44 mg/dL (ref >39)
LDL CALC: 140 mg/dL — AB (ref 0–99)
Triglycerides: 93 mg/dL (ref 0–149)
VLDL CHOLESTEROL CAL: 19 mg/dL (ref 5–40)

## 2017-07-05 LAB — VITAMIN D 25 HYDROXY (VIT D DEFICIENCY, FRACTURES): VIT D 25 HYDROXY: 19.8 ng/mL — AB (ref 30.0–100.0)

## 2017-07-05 LAB — COMPREHENSIVE METABOLIC PANEL
ALBUMIN: 4.4 g/dL (ref 3.5–5.5)
ALK PHOS: 31 IU/L — AB (ref 39–117)
ALT: 11 IU/L (ref 0–32)
AST: 17 IU/L (ref 0–40)
Albumin/Globulin Ratio: 1.3 (ref 1.2–2.2)
BILIRUBIN TOTAL: 1.2 mg/dL (ref 0.0–1.2)
BUN / CREAT RATIO: 14 (ref 9–23)
BUN: 12 mg/dL (ref 6–24)
CO2: 22 mmol/L (ref 20–29)
CREATININE: 0.87 mg/dL (ref 0.57–1.00)
Calcium: 9.4 mg/dL (ref 8.7–10.2)
Chloride: 104 mmol/L (ref 96–106)
GFR calc Af Amer: 94 mL/min/{1.73_m2} (ref >59)
GFR calc non Af Amer: 82 mL/min/{1.73_m2} (ref >59)
GLOBULIN, TOTAL: 3.3 g/dL (ref 1.5–4.5)
GLUCOSE: 88 mg/dL (ref 65–99)
Potassium: 4.4 mmol/L (ref 3.5–5.2)
SODIUM: 140 mmol/L (ref 134–144)
Total Protein: 7.7 g/dL (ref 6.0–8.5)

## 2017-07-05 LAB — HEMOGLOBIN A1C
Est. average glucose Bld gHb Est-mCnc: 105 mg/dL
HEMOGLOBIN A1C: 5.3 % (ref 4.8–5.6)

## 2017-10-02 ENCOUNTER — Other Ambulatory Visit: Payer: 59

## 2017-10-04 ENCOUNTER — Other Ambulatory Visit (INDEPENDENT_AMBULATORY_CARE_PROVIDER_SITE_OTHER): Payer: 59

## 2017-10-04 DIAGNOSIS — E559 Vitamin D deficiency, unspecified: Secondary | ICD-10-CM

## 2017-10-05 LAB — VITAMIN D 25 HYDROXY (VIT D DEFICIENCY, FRACTURES): Vit D, 25-Hydroxy: 23 ng/mL — ABNORMAL LOW (ref 30.0–100.0)

## 2017-10-10 ENCOUNTER — Other Ambulatory Visit: Payer: Self-pay

## 2017-10-10 DIAGNOSIS — E559 Vitamin D deficiency, unspecified: Secondary | ICD-10-CM

## 2017-10-10 NOTE — Telephone Encounter (Signed)
lmtcb

## 2017-10-11 MED ORDER — VITAMIN D (ERGOCALCIFEROL) 1.25 MG (50000 UNIT) PO CAPS: 50000.0000 [IU] | ORAL_CAPSULE | ORAL | 0 refills | Status: DC

## 2017-10-11 NOTE — Telephone Encounter (Signed)
Pt notified of results & rx sent to pharmacy. 4mth lab appt made.

## 2018-01-01 ENCOUNTER — Other Ambulatory Visit: Payer: Self-pay | Admitting: Certified Nurse Midwife

## 2018-01-10 ENCOUNTER — Other Ambulatory Visit: Payer: 59

## 2018-01-10 ENCOUNTER — Other Ambulatory Visit (INDEPENDENT_AMBULATORY_CARE_PROVIDER_SITE_OTHER): Payer: 59

## 2018-01-10 DIAGNOSIS — E559 Vitamin D deficiency, unspecified: Secondary | ICD-10-CM

## 2018-01-11 LAB — VITAMIN D 25 HYDROXY (VIT D DEFICIENCY, FRACTURES): Vit D, 25-Hydroxy: 36.5 ng/mL (ref 30.0–100.0)

## 2018-01-25 ENCOUNTER — Other Ambulatory Visit: Payer: Self-pay | Admitting: Certified Nurse Midwife

## 2018-01-27 NOTE — Telephone Encounter (Signed)
Medication refill request: Vitamin D Last AEX:  06/2017 Next AEX: 06/2018 Last MMG (if hormonal medication request): n/a Refill authorized: #12, 0 refills

## 2018-01-27 NOTE — Telephone Encounter (Signed)
Looks like Janice Thomas refilled, can we call her and check

## 2018-04-13 ENCOUNTER — Other Ambulatory Visit: Payer: Self-pay

## 2018-04-13 ENCOUNTER — Encounter (HOSPITAL_COMMUNITY): Payer: Self-pay | Admitting: *Deleted

## 2018-04-13 ENCOUNTER — Emergency Department (HOSPITAL_COMMUNITY)
Admission: EM | Admit: 2018-04-13 | Discharge: 2018-04-13 | Disposition: A | Discharge status: Home / Self Care | Payer: 59 | Attending: Emergency Medicine | Admitting: Emergency Medicine

## 2018-04-13 ENCOUNTER — Emergency Department (HOSPITAL_COMMUNITY): Payer: 59

## 2018-04-13 DIAGNOSIS — Z79899 Other long term (current) drug therapy: Secondary | ICD-10-CM | POA: Insufficient documentation

## 2018-04-13 DIAGNOSIS — R0789 Other chest pain: Secondary | ICD-10-CM | POA: Diagnosis not present

## 2018-04-13 DIAGNOSIS — R079 Chest pain, unspecified: Secondary | ICD-10-CM | POA: Diagnosis present

## 2018-04-13 LAB — I-STAT TROPONIN, ED: Troponin i, poc: 0 ng/mL (ref 0.00–0.08)

## 2018-04-13 MED ORDER — NAPROXEN 500 MG PO TABS: 500.0000 mg | ORAL_TABLET | Freq: Two times a day (BID) | ORAL | 0 refills | Status: DC

## 2018-04-13 MED ORDER — METHOCARBAMOL 500 MG PO TABS: 500.0000 mg | ORAL_TABLET | Freq: Two times a day (BID) | ORAL | 0 refills | Status: DC

## 2018-04-13 MED ORDER — IBUPROFEN 400 MG PO TABS
600.0000 mg | ORAL_TABLET | Freq: Once | ORAL | Status: AC
  Administered 2018-04-13: 600 mg via ORAL
  Filled 2018-04-13: qty 1

## 2018-04-13 NOTE — ED Triage Notes (Signed)
PT reports upon waking this AM moved her Lt arm and had pain to anterior chest wall ,Lt arm and back pain. Pt reports pain is worse with coughing. Pt denies any URI Sx's.

## 2018-04-13 NOTE — ED Provider Notes (Signed)
[facility] Provider Note   CSN: [id number] Arrival date & time: 04/13/18  0736     History   Chief Complaint Chief Complaint  Patient presents with  . arm strain    HPI Janice Thomas is a 42 y.o. female presenting with left anterior chest wall pain radiating to left scapula onset 30 minutes ago. Patient reports she woke up and stretched her left arm before symptoms began. Patient states symptoms have been constant since onset. Patient reports pain is worse with movement and better with rest. Patient describes pain as a pulling sensation with movement. Patient states she has not tried anything for her symptoms. Patient denies fever, chills, or night sweats. Patient denies nausea, vomiting, abdominal pain, or shortness of breath. Patient denies a cardiac history, edema in extremities, tobacco use, oral contraceptive use, history of DVT/PE, or recent surgery.  LMP started today.   HPI  Past Medical History:  Diagnosis Date  . Abnormal Pap smear of cervix 12/11   LGSIL    Patient Active Problem List   Diagnosis Date Noted  . Enlarged uterus 06/30/2014  . Submucous leiomyoma of uterus 06/30/2014  . Intramural leiomyoma of uterus 06/30/2014    Past Surgical History:  Procedure Laterality Date  . COLPOSCOPY  1/12   mild dysplasia CIN1, HPV effect  . DILATION AND CURETTAGE OF UTERUS       OB History    Gravida  3   Para  2   Term  2   Preterm      AB  1   Living  2     SAB  1   TAB      Ectopic      Multiple      Live Births  2            Home Medications    Prior to Admission medications   Medication Sig Start Date End Date Taking? Authorizing Provider  GARLIC PO Take by mouth as needed.    [provider]  methocarbamol (ROBAXIN) 500 MG tablet Take 1 tablet (500 mg total) by mouth 2 (two) times daily. 04/13/18   Darlin Drop P, PA-C  Multiple Vitamins-Minerals (MULTIVITAMIN PO) Take by mouth  daily.    [provider]  naproxen (NAPROSYN) 500 MG tablet Take 1 tablet (500 mg total) by mouth 2 (two) times daily. 04/13/18   Darlin Drop P, PA-C  Vitamin D, Ergocalciferol, (DRISDOL) 50000 units CAPS capsule Take 1 capsule (50,000 Units total) by mouth every 7 (seven) days. Patient not taking: Reported on 07/04/2017 07/05/16   Regina Eck, CNM  Vitamin D, Ergocalciferol, (DRISDOL) 50000 units CAPS capsule Take 1 capsule (50,000 Units total) by mouth every 7 (seven) days. 10/11/17   Regina Eck, CNM    Family History Family History  Problem Relation Age of Onset  . Thyroid disease Mother   . Hypertension Mother   . Diabetes Mother   . Heart attack Mother   . Sickle cell anemia Sister   . Deep vein thrombosis Sister     Social History Social History   Tobacco Use  . Smoking status: Never Smoker  . Smokeless tobacco: Never Used  Substance Use Topics  . Alcohol use: No  . Drug use: No     Allergies   Patient has no known allergies.   Review of Systems Review of Systems  Constitutional: Negative for activity change, appetite change, chills, diaphoresis, fatigue, fever and unexpected  weight change.  HENT: Negative for congestion and rhinorrhea.   Respiratory: Negative for cough and shortness of breath.   Cardiovascular: Positive for chest pain. Negative for palpitations and leg swelling.  Gastrointestinal: Negative for abdominal pain, nausea and vomiting.  Endocrine: Negative for cold intolerance and heat intolerance.  Musculoskeletal: Negative for back pain.  Skin: Negative for color change and wound.  Allergic/Immunologic: Negative for immunocompromised state.  Neurological: Negative for dizziness, syncope, weakness and light-headedness.  Psychiatric/Behavioral: Negative for agitation and behavioral problems. The patient is not nervous/anxious.      Physical Exam Updated Vital Signs BP 129/82 (BP Location: Right Arm)   Pulse 80   Temp 98.4  F (36.9 C) (Oral)   Resp 16   Ht 5\' 5"  (1.651 m)   Wt 74.8 kg   LMP 04/13/2018   SpO2 100%   BMI 27.46 kg/m   Physical Exam  Constitutional: She appears well-developed and well-nourished. No distress.  HENT:  Head: Normocephalic and atraumatic.  Neck: Normal range of motion. Neck supple. No JVD present.  Cardiovascular: Normal rate, regular rhythm, normal heart sounds, intact distal pulses and normal pulses. Exam reveals no gallop and no friction rub.  No murmur heard. Pulses:      Radial pulses are 2+ on the right side, and 2+ on the left side.       Dorsalis pedis pulses are 2+ on the right side, and 2+ on the left side.  Pulmonary/Chest: Effort normal and breath sounds normal. No respiratory distress. She has no wheezes. She has no rales. She exhibits tenderness.    Abdominal: Soft. There is no tenderness.  Musculoskeletal:       Left shoulder: She exhibits tenderness (Mild tenderness noted with flexion of left shoulder. ). She exhibits normal range of motion and no bony tenderness.       Left elbow: Normal. She exhibits normal range of motion, no swelling, no effusion and no deformity.       Cervical back: Normal. She exhibits normal range of motion, no tenderness, no bony tenderness and no swelling.       Thoracic back: Normal. She exhibits normal range of motion, no tenderness, no bony tenderness and no swelling.       Arms: Sensation is intact in upper extremities bilaterally. Radial pulses are 2+.   Neurological: She is alert.  Skin: Skin is warm. Capillary refill takes less than 2 seconds. No rash noted. She is not diaphoretic. No erythema. No pallor.  Psychiatric: She has a normal mood and affect.  Nursing note and vitals reviewed.   ED Treatments / Results  Labs (all labs ordered are listed, but only abnormal results are displayed) Labs Reviewed  I-STAT TROPONIN, ED    EKG EKG Interpretation  Date/Time:  Sunday April 13 2018 08:18:59 EST Ventricular  Rate:  74 PR Interval:  156 QRS Duration: 74 QT Interval:  366 QTC Calculation: 406 R Axis:   35 Text Interpretation:  Normal sinus rhythm Normal ECG No old tracing to compare Confirmed by Heathcote, Inocente Salles [phone]) on 04/13/2018 8:23:34 AM   Radiology Dg Chest 2 View  Result Date: 04/13/2018 CLINICAL DATA:  Chest wall pain EXAM: CHEST - 2 VIEW COMPARISON:  None. FINDINGS: The heart size and mediastinal contours are within normal limits. Both lungs are clear. The visualized skeletal structures are unremarkable. IMPRESSION: No active cardiopulmonary disease. Electronically Signed   By: Jerilynn Mages.  Shick M.D.   On: 04/13/2018 08:41    Procedures Procedures (including critical  care time)  Medications Ordered in ED Medications  ibuprofen (ADVIL,MOTRIN) tablet 600 mg (600 mg Oral Given 04/13/18 0825)     Initial Impression / Assessment and Plan / ED Course  I have reviewed the triage vital signs and the nursing notes.  Pertinent labs & imaging results that were available during my care of the patient were reviewed by me and considered in my medical decision making (see chart for details).  Clinical Course as of Apr 14 907  Sun Apr 13, 2018  4888 No active cardiopulmonary disease present on CXR.  DG Chest 2 View [AH]  [id number] Pt reports pain has improved while in the ER.   [AH]  0904 Negative troponin.   I-stat troponin, ED [AH]    Clinical Course User Index [AH] Arville Lime, PA-C   Suspect symptoms are likely musculoskeletal due to history and physical exam. Will prescribe naproxen and robaxin for symptoms. Patient is to be discharged with recommendation to follow up with PCP in regards to today's hospital visit. Chest pain is not likely of cardiac or pulmonary etiology d/t presentation, PERC negative, VSS, no tracheal deviation, no JVD or new murmur, RRR, breath sounds equal bilaterally, EKG without acute abnormalities, negative troponin, and negative CXR. Pt has been advised to return to  the ED if CP becomes exertional, associated with diaphoresis or nausea, radiates to left jaw/arm, worsens or becomes concerning in any way. Pt appears reliable for follow up and is agreeable to discharge.    Final Clinical Impressions(s) / ED Diagnoses   Final diagnoses:  Chest wall pain    ED Discharge Orders         Ordered    methocarbamol (ROBAXIN) 500 MG tablet  2 times daily     04/13/18 0907    naproxen (NAPROSYN) 500 MG tablet  2 times daily     04/13/18 0907           Arville Lime, PA-C 04/13/18 9169    Milton Ferguson, MD 04/13/18 1047

## 2018-04-13 NOTE — ED Notes (Signed)
Declined W/C at D/C and was escorted to lobby by RN. 

## 2018-04-13 NOTE — Discharge Instructions (Addendum)
You have been seen today for chest wall pain. Please read and follow all provided instructions.   1. Medications: naproxen for pain, robaxin for a muscle relaxant (muscle relaxant may make you drowsy - do not drive or operate heavy machinery while taking this medication), usual home medications 2. Treatment: rest, drink plenty of fluids, RICE for routine care of injuries 3. Follow Up: Please follow up with your primary doctor in 3 days for discussion of your diagnoses and further evaluation after today's visit; if you do not have a primary care doctor use the resource guide provided to find one; Please return to the ER for any new or worsening symptoms.   Take medications as prescribed. Return to the emergency room for worsening condition or new concerning symptoms. Follow up with your regular doctor. If you don't have a regular doctor use one of the numbers below to establish a primary care doctor.   Emergency Department Resource Guide 1) Find a Doctor and Pay Out of Pocket Although you won't have to find out who is covered by your insurance plan, it is a good idea to ask around and get recommendations. You will then need to call the office and see if the doctor you have chosen will accept you as a new patient and what types of options they offer for patients who are self-pay. Some doctors offer discounts or will set up payment plans for their patients who do not have insurance, but you will need to ask so you aren't surprised when you get to your appointment.  2) Contact Your Local Health Department Not all health departments have doctors that can see patients for sick visits, but many do, so it is worth a call to see if yours does. If you don't know where your local health department is, you can check in your phone book. The CDC also has a tool to help you locate your state's health department, and many state websites also have listings of all of their local health departments.  3) Find a [facility] If your illness is not likely to be very severe or complicated, you may want to try a walk in clinic. These are popping up all over the country in pharmacies, drugstores, and shopping centers. They're usually staffed by nurse practitioners or physician assistants that have been trained to treat common illnesses and complaints. They're usually fairly quick and inexpensive. However, if you have serious medical issues or chronic medical problems, these are probably not your best option.  No Primary Care Doctor: Call Health Connect at  [phone] - they can help you locate a primary care doctor that  accepts your insurance, provides certain services, etc. Physician Referral [phone]  Emergency Department Resource Guide 1) Find a Doctor and Pay Out of Pocket Although you won't have to find out who is covered by your insurance plan, it is a good idea to ask around and get recommendations. You will then need to call the office and see if the doctor you have chosen will accept you as a new patient and what types of options they offer for patients who are self-pay. Some doctors offer discounts or will set up payment plans for their patients who do not have insurance, but you will need to ask so you aren't surprised when you get to your appointment.  2) Contact Your Local Health Department Not all health departments have doctors that can see patients for sick visits, but many do, so it is worth a  call to see if yours does. If you don't know where your local health department is, you can check in your phone book. The CDC also has a tool to help you locate your state's health department, and many state websites also have listings of all of their local health departments.  3) Find a [facility] If your illness is not likely to be very severe or complicated, you may want to try a walk in clinic. These are popping up all over the country in pharmacies, drugstores, and shopping centers. They're  usually staffed by nurse practitioners or physician assistants that have been trained to treat common illnesses and complaints. They're usually fairly quick and inexpensive. However, if you have serious medical issues or chronic medical problems, these are probably not your best option.  No Primary Care Doctor: Call Health Connect at  [phone] - they can help you locate a primary care doctor that  accepts your insurance, provides certain services, etc. Physician Referral Service- [phone]  Chronic Pain Problems: Organization         Address  Phone   Notes  [facility]  [phone] Patients need to be referred by their primary care doctor.   Medication Assistance: Organization         Address  Phone   Notes  [facility] Medication [facility] [address] [place], [place] [address] [phone] --Must be a resident of [facility] -- Must have NO insurance coverage whatsoever (no Medicaid/ Medicare, etc.) -- The pt. MUST have a primary care doctor that directs their care regularly and follows them in the community   [facility]  [phone]   [employer]  [phone]    Agencies that provide inexpensive medical care: Organization         Address  Phone   Notes  [place]  [phone]   [facility]    [phone]   [place], [place] [address] [phone]   [place] [address], [place] [phone]   [facility]    [phone]   [facility]    [phone]   [place] [address], [place] Phone:  [phone], Fax:  [phone] Hours of Operation:  9 am - 6 pm, M-F.  Also accepts Medicaid/Medicare and self-pay.  [facility] [place], [address], [place] Phone: [phone], Fax: [phone]. Hours of  Operation:  8:30 am - 5:30 pm, M-F.  Also accepts Medicaid and self-pay.  [facility] [place] [address], [place] Phone: [phone]   [place], [place], [place] [phone], Ext. [phone] Mondays & Thursdays: 7-9 AM.  First 15 patients are seen on a first come, first serve basis.    [place] Providers:  Organization         Address  Phone   Notes  [facility] [address], [place] [phone] Also accepts self-pay patients.  [place], [place]  [phone]   [place], [address], [place] [phone]   [place] [address], [place] [phone]   Lucianne Lei [address], [place]   [phone] Only accepts [place] Access Medicaid patients after they have their name applied to their card.   Self-Pay (no insurance) in [facility]:  Organization         Address  Phone   Notes  Sickle Cell Patients, [facility] [place] [phone]   [facility] [place] [phone]   [facility] [place], [place], [place] [phone]   [facility]/Dr. Osei-Bonsu  [address], [place] or [address], [place] [phone] Phone number for both [place] and [place] locations is the same.  Urgent [facility] and [facility] [address] [place] [phone]   [facility] [address] [place] or [address] [phone]   [facility] [address], [place] [phone], phone; [phone], fax Sees patients 1st and 3rd Saturday of every month.  Must not qualify for public or private insurance (i.e. Medicaid, Medicare,  [place] Health Choice, Veterans' Benefits)  Household income should be no more than 200% of the poverty level The clinic cannot treat you if you are pregnant or think you are pregnant  Sexually transmitted diseases are not treated at the clinic.

## 2018-04-13 NOTE — ED Triage Notes (Signed)
Pt states she was stretching her left arm in bed and pulled her left breast arm and back. She has pain worse with movement now.

## 2018-07-08 ENCOUNTER — Ambulatory Visit (INDEPENDENT_AMBULATORY_CARE_PROVIDER_SITE_OTHER): Payer: 59 | Admitting: Certified Nurse Midwife

## 2018-07-08 ENCOUNTER — Other Ambulatory Visit: Payer: Self-pay

## 2018-07-08 ENCOUNTER — Encounter: Payer: Self-pay | Admitting: Certified Nurse Midwife

## 2018-07-08 VITALS — BP 118/72 | HR 74 | Resp 16 | Ht 64.5 in | Wt 166.0 lb

## 2018-07-08 DIAGNOSIS — Z113 Encounter for screening for infections with a predominantly sexual mode of transmission: Secondary | ICD-10-CM | POA: Diagnosis not present

## 2018-07-08 DIAGNOSIS — E559 Vitamin D deficiency, unspecified: Secondary | ICD-10-CM

## 2018-07-08 DIAGNOSIS — Z01419 Encounter for gynecological examination (general) (routine) without abnormal findings: Secondary | ICD-10-CM | POA: Diagnosis not present

## 2018-07-08 DIAGNOSIS — Z0000 Encounter for general adult medical examination without abnormal findings: Secondary | ICD-10-CM

## 2018-07-08 DIAGNOSIS — N852 Hypertrophy of uterus: Secondary | ICD-10-CM

## 2018-07-08 DIAGNOSIS — Z86018 Personal history of other benign neoplasm: Secondary | ICD-10-CM

## 2018-07-08 DIAGNOSIS — J069 Acute upper respiratory infection, unspecified: Secondary | ICD-10-CM | POA: Insufficient documentation

## 2018-07-08 NOTE — Progress Notes (Signed)
43 y.o. [id number] Single  African American Fe here for annual exam. Periods normal, no issues.  Contraception condoms, working well. Sees PCP prn, no visits in the last year. No partner change, STD screening requested. Still taking Vitamin D for deficiency feels better. No change in fibroids that she is aware of. Needs screening labs for work and form filled out when in. No other health issues today.  Patient's last menstrual period was 06/29/2018.          Sexually active: Yes.    The current method of family planning is condoms most of the time.    Exercising: Yes.    walking Smoker:  no  Review of Systems  Constitutional: Negative.   HENT: Negative.   Eyes: Negative.   Respiratory: Negative.   Cardiovascular: Negative.   Gastrointestinal: Negative.   Genitourinary: Negative.   Musculoskeletal: Negative.   Skin: Negative.   Neurological: Negative.   Endo/Heme/Allergies: Negative.   Psychiatric/Behavioral: Negative.     Health Maintenance: Pap:  07-03-16 neg HPV HR neg History of Abnormal Pap: yes, years ago per patient  MMG:  Patient has appt tomorrow 07/09/18 Self Breast exams: yes Colonoscopy:  none BMD:   none TDaP:  2016 Shingles: no Pneumonia: no Hep C and HIV: Hep c neg 2016, HIV done in 2015 maybe Labs: discuss labs if needed   reports that she has never smoked. She has never used smokeless tobacco. She reports that she does not drink alcohol or use drugs.  Past Medical History:  Diagnosis Date  . Abnormal Pap smear of cervix 12/11   LGSIL    Past Surgical History:  Procedure Laterality Date  . COLPOSCOPY  1/12   mild dysplasia CIN1, HPV effect  . DILATION AND CURETTAGE OF UTERUS      Current Outpatient Medications  Medication Sig Dispense Refill  . Multiple Vitamins-Minerals (MULTIVITAMIN PO) Take by mouth daily.    . Vitamin D, Ergocalciferol, (DRISDOL) 50000 units CAPS capsule Take 1 capsule (50,000 Units total) by mouth every 7 (seven) days. 12 capsule 0   . Vitamin D, Ergocalciferol, (DRISDOL) 50000 units CAPS capsule Take 1 capsule (50,000 Units total) by mouth every 7 (seven) days. 12 capsule 0  . GARLIC PO Take by mouth as needed.     No current facility-administered medications for this visit.     Family History  Problem Relation Age of Onset  . Thyroid disease Mother   . Hypertension Mother   . Diabetes Mother   . Heart attack Mother   . Sickle cell anemia Sister   . Deep vein thrombosis Sister     ROS:  Pertinent items are noted in HPI.  Otherwise, a comprehensive ROS was negative.  Exam:   BP (!) 160/72 (BP Location: Right Arm, Patient Position: Sitting, Cuff Size: Normal)   Pulse 76   Resp 16   Ht 5' 4.5" (1.638 m)   Wt 166 lb (75.3 kg)   LMP 06/29/2018   BMI 28.05 kg/m  Height: 5' 4.5" (163.8 cm) Ht Readings from Last 3 Encounters:  07/08/18 5' 4.5" (1.638 m)  04/13/18 5\' 5"  (1.651 m)  07/04/17 5' 4.75" (1.645 m)    General appearance: alert, cooperative and appears stated age Head: Normocephalic, without obvious abnormality, atraumatic Neck: no adenopathy, supple, symmetrical, trachea midline and thyroid normal to inspection and palpation Lungs: clear to auscultation bilaterally Breasts: normal appearance, no masses or tenderness, No nipple retraction or dimpling, No nipple discharge or bleeding, No axillary or  supraclavicular adenopathy Heart: regular rate and rhythm Abdomen: soft, non-tender; no masses,  no organomegaly, enlarged uterus palpated in abdomen Extremities: extremities normal, atraumatic, no cyanosis or edema Skin: Skin color, texture, turgor normal. No rashes or lesions Lymph nodes: Cervical, supraclavicular, and axillary nodes normal. No abnormal inguinal nodes palpated Neurologic: Grossly normal   Pelvic: External genitalia:  no lesions, normal female              Urethra:  normal appearing urethra with no masses, tenderness or lesions              Bartholin's and Skene's: normal                  Vagina: normal appearing vagina with normal color and discharge, no lesions              Cervix: no cervical motion tenderness, no lesions and normal appearance              Pap taken: No. Bimanual Exam:  Uterus:  enlarged, 14 weeks size weeks size, known fibroids no size change              Adnexa: normal adnexa, no mass, fullness, tenderness and limited feel due to fibroid presence.               Rectovaginal: Confirms               Anus:  normal sphincter tone, no lesions  Chaperone present: yes  A:  Well Woman with normal exam  Contraception condoms  Enlarged uterus with history of fibroids no size change  STD screening  Screening labs   P:   Reviewed health and wellness pertinent to exam  Stressed consistent use  Discussed no change noted with uterine size and need to advise if symptoms occur.  Labs: TSH, Vitamin D, Lipid panel, CMP, CBC  Labs: Gc/chlamydia, Affirm  Pap smear: no   counseled on breast self exam, STD prevention, HIV risk factors and prevention, adequate intake of calcium and vitamin D, diet and exercise  return annually or prn  An After Visit Summary was printed and given to the patient.

## 2018-07-08 NOTE — Patient Instructions (Signed)

## 2018-07-09 ENCOUNTER — Other Ambulatory Visit: Payer: Self-pay | Admitting: Certified Nurse Midwife

## 2018-07-09 ENCOUNTER — Telehealth: Payer: Self-pay

## 2018-07-09 ENCOUNTER — Encounter: Payer: Self-pay | Admitting: Certified Nurse Midwife

## 2018-07-09 DIAGNOSIS — R899 Unspecified abnormal finding in specimens from other organs, systems and tissues: Secondary | ICD-10-CM

## 2018-07-09 LAB — LIPID PANEL
Chol/HDL Ratio: 5.1 ratio — ABNORMAL HIGH (ref 0.0–4.4)
Cholesterol, Total: 205 mg/dL — ABNORMAL HIGH (ref 100–199)
HDL: 40 mg/dL (ref >39)
LDL Calculated: 135 mg/dL — ABNORMAL HIGH (ref 0–99)
Triglycerides: 150 mg/dL — ABNORMAL HIGH (ref 0–149)
VLDL Cholesterol Cal: 30 mg/dL (ref 5–40)

## 2018-07-09 LAB — COMPREHENSIVE METABOLIC PANEL
ALT: 17 IU/L (ref 0–32)
AST: 15 IU/L (ref 0–40)
Albumin/Globulin Ratio: 1.5 (ref 1.2–2.2)
Albumin: 4.3 g/dL (ref 3.8–4.8)
Alkaline Phosphatase: 38 IU/L — ABNORMAL LOW (ref 39–117)
BUN/Creatinine Ratio: 12 (ref 9–23)
BUN: 8 mg/dL (ref 6–24)
Bilirubin Total: 0.9 mg/dL (ref 0.0–1.2)
CO2: 23 mmol/L (ref 20–29)
CREATININE: 0.68 mg/dL (ref 0.57–1.00)
Calcium: 9.3 mg/dL (ref 8.7–10.2)
Chloride: 100 mmol/L (ref 96–106)
GFR calc Af Amer: 123 mL/min/{1.73_m2} (ref >59)
GFR, EST NON AFRICAN AMERICAN: 107 mL/min/{1.73_m2} (ref >59)
Globulin, Total: 2.9 g/dL (ref 1.5–4.5)
Glucose: 94 mg/dL (ref 65–99)
POTASSIUM: 4.3 mmol/L (ref 3.5–5.2)
Sodium: 135 mmol/L (ref 134–144)
Total Protein: 7.2 g/dL (ref 6.0–8.5)

## 2018-07-09 LAB — CBC
Hematocrit: 30.9 % — ABNORMAL LOW (ref 34.0–46.6)
Hemoglobin: 9.3 g/dL — ABNORMAL LOW (ref 11.1–15.9)
MCH: 24 pg — AB (ref 26.6–33.0)
MCHC: 30.1 g/dL — ABNORMAL LOW (ref 31.5–35.7)
MCV: 80 fL (ref 79–97)
Platelets: 407 10*3/uL (ref 150–450)
RBC: 3.87 x10E6/uL (ref 3.77–5.28)
RDW: 14.7 % (ref 11.7–15.4)
WBC: 4.8 10*3/uL (ref 3.4–10.8)

## 2018-07-09 LAB — VAGINITIS/VAGINOSIS, DNA PROBE
Candida Species: NEGATIVE
Gardnerella vaginalis: NEGATIVE
Trichomonas vaginosis: NEGATIVE

## 2018-07-09 LAB — TSH: TSH: 2.03 u[IU]/mL (ref 0.450–4.500)

## 2018-07-09 LAB — VITAMIN D 25 HYDROXY (VIT D DEFICIENCY, FRACTURES): Vit D, 25-Hydroxy: 25.8 ng/mL — ABNORMAL LOW (ref 30.0–100.0)

## 2018-07-09 LAB — GC/CHLAMYDIA PROBE AMP
Chlamydia trachomatis, NAA: NEGATIVE
Neisseria gonorrhoeae by PCR: NEGATIVE

## 2018-07-09 NOTE — Telephone Encounter (Signed)
Left detailed message letting patient know her health form had been filled out & faxed back to [facility]

## 2018-07-10 ENCOUNTER — Other Ambulatory Visit: Payer: Self-pay | Admitting: Certified Nurse Midwife

## 2018-07-10 ENCOUNTER — Telehealth: Payer: Self-pay

## 2018-07-10 DIAGNOSIS — D508 Other iron deficiency anemias: Secondary | ICD-10-CM

## 2018-07-10 NOTE — Telephone Encounter (Signed)
Patient notified of results.

## 2018-07-10 NOTE — Telephone Encounter (Signed)
Left message for patient to callback to give results for cmp & cbc.  CMP normal. CBC shows hemoglobin at 9.3. Needs to start time release iron once daily with orange juice & be rechecked in 8weeks.

## 2018-07-10 NOTE — Telephone Encounter (Signed)
Lab appointment scheduled. Is ferritin level the only lab that she needs in regards to iron? Please advise.

## 2018-07-10 NOTE — Telephone Encounter (Signed)
She needs CBC and TIBC

## 2018-09-04 ENCOUNTER — Other Ambulatory Visit: Payer: 59

## 2018-09-05 ENCOUNTER — Other Ambulatory Visit (INDEPENDENT_AMBULATORY_CARE_PROVIDER_SITE_OTHER): Payer: 59

## 2018-09-05 ENCOUNTER — Other Ambulatory Visit: Payer: Self-pay

## 2018-09-05 DIAGNOSIS — R899 Unspecified abnormal finding in specimens from other organs, systems and tissues: Secondary | ICD-10-CM

## 2018-09-05 DIAGNOSIS — D509 Iron deficiency anemia, unspecified: Secondary | ICD-10-CM

## 2018-09-05 DIAGNOSIS — D508 Other iron deficiency anemias: Secondary | ICD-10-CM

## 2018-09-06 LAB — IRON AND TIBC
Iron Saturation: 6 % — CL (ref 15–55)
Iron: 21 ug/dL — ABNORMAL LOW (ref 27–159)
Total Iron Binding Capacity: 335 ug/dL (ref 250–450)
UIBC: 314 ug/dL (ref 131–425)

## 2018-09-06 LAB — CBC
Hematocrit: 30.8 % — ABNORMAL LOW (ref 34.0–46.6)
Hemoglobin: 9.4 g/dL — ABNORMAL LOW (ref 11.1–15.9)
MCH: 23.4 pg — ABNORMAL LOW (ref 26.6–33.0)
MCHC: 30.5 g/dL — ABNORMAL LOW (ref 31.5–35.7)
MCV: 77 fL — ABNORMAL LOW (ref 79–97)
Platelets: 419 10*3/uL (ref 150–450)
RBC: 4.02 x10E6/uL (ref 3.77–5.28)
RDW: 14.6 % (ref 11.7–15.4)
WBC: 4.4 10*3/uL (ref 3.4–10.8)

## 2018-09-06 LAB — LIPID PANEL
Chol/HDL Ratio: 5 ratio — ABNORMAL HIGH (ref 0.0–4.4)
Cholesterol, Total: 214 mg/dL — ABNORMAL HIGH (ref 100–199)
HDL: 43 mg/dL (ref >39)
LDL Calculated: 149 mg/dL — ABNORMAL HIGH (ref 0–99)
Triglycerides: 110 mg/dL (ref 0–149)
VLDL Cholesterol Cal: 22 mg/dL (ref 5–40)

## 2018-09-06 LAB — FERRITIN: Ferritin: 4 ng/mL — ABNORMAL LOW (ref 15–150)

## 2018-09-22 ENCOUNTER — Telehealth: Payer: Self-pay

## 2018-09-22 DIAGNOSIS — D508 Other iron deficiency anemias: Secondary | ICD-10-CM

## 2018-09-22 DIAGNOSIS — Z86018 Personal history of other benign neoplasm: Secondary | ICD-10-CM

## 2018-09-22 DIAGNOSIS — N852 Hypertrophy of uterus: Secondary | ICD-10-CM

## 2018-09-22 DIAGNOSIS — E7800 Pure hypercholesterolemia, unspecified: Secondary | ICD-10-CM

## 2018-09-22 NOTE — Telephone Encounter (Signed)
Spoke with patient. Advised of results and message as seen below from [place]. Patient verbalizes understanding. Referral to Dr.Wallace placed. Referral to Dr.Ennever marked as urgent placed. Patient is aware she will be contacted directly to schedule an appointment with Dr.Wallace and Dr.Ennever. PUS scheduled for 10/02/2018 at 3:30 pm with 4 pm consult with Dr.Jertson. Patient is agreeable to date and time. Order placed for precert.  Cc: Mirna Mires  Routing to provider and will close encounter.

## 2018-09-22 NOTE — Telephone Encounter (Signed)
-----   Message from Salvadore Dom, MD sent at 09/22/2018  2:49 PM EDT ----- This patient continues to have significant anemia. Please set her up with hematology for an iron transfusion.  Please set her up for a pelvic ultrasound and MD visit. Her triglycerides are better, but she still has an elevated LDL and lipid ratio. She should establish care with primary MD.

## 2018-09-29 ENCOUNTER — Telehealth: Payer: Self-pay | Admitting: Family

## 2018-09-29 NOTE — Telephone Encounter (Signed)
Spoke with patient to confirm new patient apt 5/28 at 3 pm. Date/time due to pt work schedule.

## 2018-09-30 ENCOUNTER — Telehealth: Payer: Self-pay | Admitting: Obstetrics and Gynecology

## 2018-09-30 NOTE — Telephone Encounter (Signed)
Call placed to review benefits for scheduled ultrasound appointment. Left voicemail message requesting a return call

## 2018-10-02 ENCOUNTER — Encounter: Payer: Self-pay | Admitting: Obstetrics and Gynecology

## 2018-10-02 ENCOUNTER — Ambulatory Visit (INDEPENDENT_AMBULATORY_CARE_PROVIDER_SITE_OTHER): Payer: 59

## 2018-10-02 ENCOUNTER — Ambulatory Visit (INDEPENDENT_AMBULATORY_CARE_PROVIDER_SITE_OTHER): Payer: 59 | Admitting: Obstetrics and Gynecology

## 2018-10-02 ENCOUNTER — Other Ambulatory Visit: Payer: Self-pay

## 2018-10-02 VITALS — BP 124/82 | HR 76 | Temp 98.2°F | Wt 169.2 lb

## 2018-10-02 DIAGNOSIS — D508 Other iron deficiency anemias: Secondary | ICD-10-CM

## 2018-10-02 DIAGNOSIS — E559 Vitamin D deficiency, unspecified: Secondary | ICD-10-CM

## 2018-10-02 DIAGNOSIS — D259 Leiomyoma of uterus, unspecified: Secondary | ICD-10-CM | POA: Diagnosis not present

## 2018-10-02 DIAGNOSIS — N852 Hypertrophy of uterus: Secondary | ICD-10-CM | POA: Diagnosis not present

## 2018-10-02 DIAGNOSIS — N920 Excessive and frequent menstruation with regular cycle: Secondary | ICD-10-CM | POA: Diagnosis not present

## 2018-10-02 DIAGNOSIS — Z86018 Personal history of other benign neoplasm: Secondary | ICD-10-CM

## 2018-10-02 LAB — POCT URINE PREGNANCY: Preg Test, Ur: NEGATIVE

## 2018-10-02 MED ORDER — NORETHIN ACE-ETH ESTRAD-FE 1-20 MG-MCG PO TABS: 1.0000 | ORAL_TABLET | Freq: Every day | ORAL | 0 refills | Status: DC

## 2018-10-02 NOTE — Addendum Note (Signed)
Addended by: Gwendlyn Deutscher on: 10/02/2018 05:15 PM   Modules accepted: Orders

## 2018-10-02 NOTE — Patient Instructions (Signed)

## 2018-10-02 NOTE — Progress Notes (Signed)
GYNECOLOGY  VISIT   HPI: 43 y.o.   Single Black or African American Not Hispanic or Latino  female   [phone] with Patient's last menstrual period was 09/22/2018 (approximate).   here for consult following PUS.  The patient has a known fibroid uterus. Recent blood work with significant anemia.  Cycles are monthly x 5 days. Getting heavier, 2 days are very heavy. She can saturate a pad in up to 15 minutes.  No cramps. Sexually active, condoms for contraception.   Non smoker, no contraindications to OCP's   GYNECOLOGIC HISTORY: Patient's last menstrual period was 09/22/2018 (approximate). Contraception: Condoms Menopausal hormone therapy: None        OB History    Gravida  3   Para  2   Term  2   Preterm      AB  1   Living  2     SAB  1   TAB      Ectopic      Multiple      Live Births  2              Patient Active Problem List   Diagnosis Date Noted  . Acute upper respiratory infection 07/08/2018  . Enlarged uterus 06/30/2014  . Submucous leiomyoma of uterus 06/30/2014  . Intramural leiomyoma of uterus 06/30/2014    Past Medical History:  Diagnosis Date  . Abnormal Pap smear of cervix 12/11   LGSIL    Past Surgical History:  Procedure Laterality Date  . COLPOSCOPY  1/12   mild dysplasia CIN1, HPV effect  . DILATION AND CURETTAGE OF UTERUS      Current Outpatient Medications  Medication Sig Dispense Refill  . GARLIC PO Take by mouth as needed.    . Multiple Vitamins-Minerals (MULTIVITAMIN PO) Take by mouth daily.    . Vitamin D, Ergocalciferol, (DRISDOL) 50000 units CAPS capsule Take 1 capsule (50,000 Units total) by mouth every 7 (seven) days. 12 capsule 0   No current facility-administered medications for this visit.      ALLERGIES: Patient has no known allergies.  Family History  Problem Relation Age of Onset  . Thyroid disease Mother   . Hypertension Mother   . Diabetes Mother   . Heart attack Mother   . Sickle cell anemia Sister    . Deep vein thrombosis Sister     Social History   Socioeconomic History  . Marital status: Single    Spouse name: Not on file  . Number of children: Not on file  . Years of education: Not on file  . Highest education level: Not on file  Occupational History  . Not on file  Social Needs  . Financial resource strain: Not on file  . Food insecurity:    Worry: Not on file    Inability: Not on file  . Transportation needs:    Medical: Not on file    Non-medical: Not on file  Tobacco Use  . Smoking status: Never Smoker  . Smokeless tobacco: Never Used  Substance and Sexual Activity  . Alcohol use: No  . Drug use: No  . Sexual activity: Yes    Partners: Male    Birth control/protection: Condom  Lifestyle  . Physical activity:    Days per week: Not on file    Minutes per session: Not on file  . Stress: Not on file  Relationships  . Social connections:    Talks on phone: Not on file  Gets together: Not on file    Attends religious service: Not on file    Active member of club or organization: Not on file    Attends meetings of clubs or organizations: Not on file    Relationship status: Not on file  . Intimate partner violence:    Fear of current or ex partner: Not on file    Emotionally abused: Not on file    Physically abused: Not on file    Forced sexual activity: Not on file  Other Topics Concern  . Not on file  Social History Narrative  . Not on file    Review of Systems  Constitutional: Negative.   HENT: Negative.   Eyes: Negative.   Respiratory: Negative.   Cardiovascular: Negative.   Gastrointestinal: Negative.   Genitourinary: Negative.   Musculoskeletal: Negative.   Skin: Negative.   Neurological: Negative.   Endo/Heme/Allergies: Negative.   Psychiatric/Behavioral: Negative.     PHYSICAL EXAMINATION:    BP 138/80 (BP Location: Left Arm, Patient Position: Sitting, Cuff Size: Normal)   Pulse 76   Temp 98.2 F (36.8 C) (Skin)   Wt 169 lb  3.2 oz (76.7 kg)   LMP 09/22/2018 (Approximate)   BMI 28.59 kg/m     General appearance: alert, cooperative and appears stated age Abdomen: soft, not tender, mass filling large amount of lower abdomen, more on the left.   Pelvic: External genitalia:  no lesions              Urethra:  normal appearing urethra with no masses, tenderness or lesions              Bartholins and Skenes: normal                 Vagina: normal appearing vagina with normal color and discharge, no lesions              Cervix: no lesions              Bimanual Exam:  Uterus:  16 week sized, not tender.               Adnexa: no mass, fullness, tenderness               Chaperone was present for exam.  The risks of endometrial biopsy were reviewed and a consent was obtained.  A speculum was placed in the vagina and the cervix was cleansed with betadine. A tenaculum was placed on the cervix and the cervix was dilated with the os finder. The mini-pipelle was placed into the endometrial cavity. The uterus sounded to ~10 cm. The endometrial biopsy was performed, moderate tissue was obtained. The tenaculum and speculum were removed. There were no complications.    Reviewed ultrasound images with the patient, multiple fibroids, slight distortion of cavity from fibroids, normal adnexa.   ASSESSMENT Menorrhagia Fibroid uterus Severe anemia Vit d def, taking 50,000 IU q week for the last 3 months. Prior to that was taking it off and on.  Repeat BP 124/84    PLAN UPT Endometrial biopsy Discussed treatment options, including OCP's, depo-provea, mirena IUD (with back up contraception) and hysterectomy She is willing to try OCP's, no contraindications, risks reviewed Appointment with hematology next week Vit d today   An After Visit Summary was printed and given to the patient.  ~25 minutes face to face time of which over 50% was spent in counseling.

## 2018-10-03 LAB — VITAMIN D 25 HYDROXY (VIT D DEFICIENCY, FRACTURES): Vit D, 25-Hydroxy: 30.9 ng/mL (ref 30.0–100.0)

## 2018-10-09 ENCOUNTER — Inpatient Hospital Stay: Payer: 59

## 2018-10-09 ENCOUNTER — Inpatient Hospital Stay: Payer: 59 | Admitting: Family

## 2018-10-13 ENCOUNTER — Telehealth: Payer: Self-pay | Admitting: Family

## 2018-10-13 NOTE — Telephone Encounter (Signed)
Called and spoke with patient regarding appointments r/s from 6/4 due to provider PAL.  She is ok with new date/time

## 2018-10-16 ENCOUNTER — Inpatient Hospital Stay: Payer: 59

## 2018-10-16 ENCOUNTER — Ambulatory Visit: Payer: 59 | Admitting: Family

## 2018-10-21 ENCOUNTER — Ambulatory Visit: Payer: 59 | Admitting: Family

## 2018-10-21 ENCOUNTER — Other Ambulatory Visit: Payer: 59

## 2018-10-22 ENCOUNTER — Other Ambulatory Visit: Payer: Self-pay | Admitting: Family

## 2018-10-22 DIAGNOSIS — D649 Anemia, unspecified: Secondary | ICD-10-CM

## 2018-10-23 ENCOUNTER — Inpatient Hospital Stay: Payer: 59 | Attending: Family

## 2018-10-23 ENCOUNTER — Other Ambulatory Visit: Payer: Self-pay

## 2018-10-23 ENCOUNTER — Encounter: Payer: Self-pay | Admitting: Family

## 2018-10-23 ENCOUNTER — Inpatient Hospital Stay (HOSPITAL_BASED_OUTPATIENT_CLINIC_OR_DEPARTMENT_OTHER): Payer: 59 | Admitting: Family

## 2018-10-23 DIAGNOSIS — N920 Excessive and frequent menstruation with regular cycle: Secondary | ICD-10-CM

## 2018-10-23 DIAGNOSIS — D500 Iron deficiency anemia secondary to blood loss (chronic): Secondary | ICD-10-CM | POA: Insufficient documentation

## 2018-10-23 DIAGNOSIS — D649 Anemia, unspecified: Secondary | ICD-10-CM

## 2018-10-23 DIAGNOSIS — D259 Leiomyoma of uterus, unspecified: Secondary | ICD-10-CM | POA: Insufficient documentation

## 2018-10-23 DIAGNOSIS — N921 Excessive and frequent menstruation with irregular cycle: Secondary | ICD-10-CM

## 2018-10-23 LAB — CBC WITH DIFFERENTIAL (CANCER CENTER ONLY)
Abs Immature Granulocytes: 0.02 10*3/uL (ref 0.00–0.07)
Basophils Absolute: 0 10*3/uL (ref 0.0–0.1)
Basophils Relative: 1 %
Eosinophils Absolute: 0 10*3/uL (ref 0.0–0.5)
Eosinophils Relative: 1 %
HCT: 31.1 % — ABNORMAL LOW (ref 36.0–46.0)
Hemoglobin: 9.3 g/dL — ABNORMAL LOW (ref 12.0–15.0)
Immature Granulocytes: 0 %
Lymphocytes Relative: 33 %
Lymphs Abs: 1.9 10*3/uL (ref 0.7–4.0)
MCH: 23.1 pg — ABNORMAL LOW (ref 26.0–34.0)
MCHC: 29.9 g/dL — ABNORMAL LOW (ref 30.0–36.0)
MCV: 77.4 fL — ABNORMAL LOW (ref 80.0–100.0)
Monocytes Absolute: 0.4 10*3/uL (ref 0.1–1.0)
Monocytes Relative: 6 %
Neutro Abs: 3.3 10*3/uL (ref 1.7–7.7)
Neutrophils Relative %: 59 %
Platelet Count: 428 10*3/uL — ABNORMAL HIGH (ref 150–400)
RBC: 4.02 MIL/uL (ref 3.87–5.11)
RDW: 16.4 % — ABNORMAL HIGH (ref 11.5–15.5)
WBC Count: 5.6 10*3/uL (ref 4.0–10.5)
nRBC: 0 % (ref 0.0–0.2)

## 2018-10-23 LAB — CMP (CANCER CENTER ONLY)
ALT: 13 U/L (ref 0–44)
AST: 14 U/L — ABNORMAL LOW (ref 15–41)
Albumin: 4.5 g/dL (ref 3.5–5.0)
Alkaline Phosphatase: 34 U/L — ABNORMAL LOW (ref 38–126)
Anion gap: 6 (ref 5–15)
BUN: 15 mg/dL (ref 6–20)
CO2: 30 mmol/L (ref 22–32)
Calcium: 9.9 mg/dL (ref 8.9–10.3)
Chloride: 99 mmol/L (ref 98–111)
Creatinine: 0.79 mg/dL (ref 0.44–1.00)
GFR, Est AFR Am: >60 mL/min (ref >60)
GFR, Estimated: >60 mL/min (ref >60)
Glucose, Bld: 106 mg/dL — ABNORMAL HIGH (ref 70–99)
Potassium: 4.1 mmol/L (ref 3.5–5.1)
Sodium: 135 mmol/L (ref 135–145)
Total Bilirubin: 0.8 mg/dL (ref 0.3–1.2)
Total Protein: 7.9 g/dL (ref 6.5–8.1)

## 2018-10-23 LAB — SAVE SMEAR(SSMR), FOR PROVIDER SLIDE REVIEW

## 2018-10-23 LAB — VITAMIN B12: Vitamin B-12: 498 pg/mL (ref 180–914)

## 2018-10-23 LAB — RETICULOCYTES
Immature Retic Fract: 24.6 % — ABNORMAL HIGH (ref 2.3–15.9)
RBC.: 4 MIL/uL (ref 3.87–5.11)
Retic Count, Absolute: 58 10*3/uL (ref 19.0–186.0)
Retic Ct Pct: 1.5 % (ref 0.4–3.1)

## 2018-10-23 NOTE — Progress Notes (Signed)
Hematology/Oncology Consultation   Name: Janice Thomas      MRN: [id number]    Location: Room/bed info not found  Date: 10/23/2018 Time:3:24 PM   REFERRING PHYSICIAN: Sumner Boast, MD  REASON FOR CONSULT: Iron deficiency anemia    DIAGNOSIS: Iron deficiency anemia secondary to heavy cycles  HISTORY OF PRESENT ILLNESS: Janice Thomas is a very pleasant 43 yo African American female with iron deficiency anemia secondary to heavy cycles. She has multiple uterine fibroids and has decided to try oral contraceptions. She picked up her Loestrin FE today from the pharmacy.  She has been taking liquid iron PO off and on.  She states that she has had some mild fatigue and SOB with over exertion (stairs).  Her cycles is regular, quite heavy and lasts 5 days.  No other bleeding, no bruising or petechiae.  She has 2 children and no history of miscarriage.  She states that her mother also had history of anemia but this may have been related to diabetes.  Her sister passed away at 34 yo from leukemia. No other cancer history that she is aware of.  She has had no fever, chills, n/v, cough, rash, dizziness, chest pain, palpitations, abdominal pain or changes in bowel or bladder habits.  No swelling, tenderness, numbness or tingling in her extremities.  No lymphadenopathy noted on exam.  She does not smoke or drink alcoholic beverages.  She has maintained a good appetite and is staying well hydrated. Her weight is stable.  She is originally from [place] and moved here 18 years ago with her job with [employer]. She works the Theatre stage manager.   ROS: All other 10 point review of systems is negative.   PAST MEDICAL HISTORY:   Past Medical History:  Diagnosis Date  . Abnormal Pap smear of cervix 12/11   LGSIL    ALLERGIES: No Known Allergies    MEDICATIONS:  Current Outpatient Medications on File Prior to Visit  Medication Sig Dispense Refill  . GARLIC PO Take by mouth as needed.    . Multiple  Vitamins-Minerals (MULTIVITAMIN PO) Take by mouth daily.    . norethindrone-ethinyl estradiol (LOESTRIN FE) 1-20 MG-MCG tablet Take 1 tablet by mouth daily. 3 Package 0  . Vitamin D, Ergocalciferol, (DRISDOL) 50000 units CAPS capsule Take 1 capsule (50,000 Units total) by mouth every 7 (seven) days. 12 capsule 0   No current facility-administered medications on file prior to visit.      PAST SURGICAL HISTORY Past Surgical History:  Procedure Laterality Date  . COLPOSCOPY  1/12   mild dysplasia CIN1, HPV effect  . DILATION AND CURETTAGE OF UTERUS      FAMILY HISTORY: Family History  Problem Relation Age of Onset  . Thyroid disease Mother   . Hypertension Mother   . Diabetes Mother   . Heart attack Mother   . Sickle cell anemia Sister   . Deep vein thrombosis Sister     SOCIAL HISTORY:  reports that she has never smoked. She has never used smokeless tobacco. She reports that she does not drink alcohol or use drugs.  PERFORMANCE STATUS: The patient's performance status is 1 - Symptomatic but completely ambulatory  PHYSICAL EXAM: Most Recent Vital Signs: Blood pressure (!) 142/69, pulse 85, resp. rate 17, height 5\' 5"  (1.651 m), weight 167 lb 1.9 oz (75.8 kg), SpO2 100 %. BP (!) 142/69 (BP Location: Left Arm, Patient Position: Sitting)   Pulse 85   Resp 17   Ht 5\' 5"  (  1.651 m)   Wt 167 lb 1.9 oz (75.8 kg)   SpO2 100%   BMI 27.81 kg/m   General Appearance:    Alert, cooperative, no distress, appears stated age  Head:    Normocephalic, without obvious abnormality, atraumatic  Eyes:    PERRL, conjunctiva/corneas clear, EOM's intact, fundi    benign, both eyes  Ears:    Normal TM's and external ear canals, both ears  Nose:   Nares normal, septum midline, mucosa normal, no drainage    or sinus tenderness  Throat:   Lips, mucosa, and tongue normal; teeth and gums normal  Neck:   Supple, symmetrical, trachea midline, no adenopathy;    thyroid:  no  enlargement/tenderness/nodules; no carotid   bruit or JVD  Back:     Symmetric, no curvature, ROM normal, no CVA tenderness  Lungs:     Clear to auscultation bilaterally, respirations unlabored  Chest Wall:    No tenderness or deformity   Heart:    Regular rate and rhythm, S1 and S2 normal, no murmur, rub   or gallop  Breast Exam:    No tenderness, masses, or nipple abnormality  Abdomen:     Soft, non-tender, bowel sounds active all four quadrants,    no masses, no organomegaly  Genitalia:    Normal female without lesion, discharge or tenderness  Rectal:    Normal tone, normal prostate, no masses or tenderness;   guaiac negative stool  Extremities:   Extremities normal, atraumatic, no cyanosis or edema  Pulses:   2+ and symmetric all extremities  Skin:   Skin color, texture, turgor normal, no rashes or lesions  Lymph nodes:   Cervical, supraclavicular, and axillary nodes normal  Neurologic:   CNII-XII intact, normal strength, sensation and reflexes    throughout    LABORATORY DATA:  Results for orders placed or performed in visit on 10/23/18 (from the past 48 hour(s))  CBC with Differential ([place] Only)     Status: Abnormal   Collection Time: 10/23/18  2:42 PM  Result Value Ref Range   WBC Count 5.6 4.0 - 10.5 K/uL   RBC 4.02 3.87 - 5.11 MIL/uL   Hemoglobin 9.3 (L) 12.0 - 15.0 g/dL    Comment: Reticulocyte Hemoglobin testing may be clinically indicated, consider ordering this additional test [id number]    HCT 31.1 (L) 36.0 - 46.0 %   MCV 77.4 (L) 80.0 - 100.0 fL   MCH 23.1 (L) 26.0 - 34.0 pg   MCHC 29.9 (L) 30.0 - 36.0 g/dL   RDW 16.4 (H) 11.5 - 15.5 %   Platelet Count 428 (H) 150 - 400 K/uL   nRBC 0.0 0.0 - 0.2 %   Neutrophils Relative % 59 %   Neutro Abs 3.3 1.7 - 7.7 K/uL   Lymphocytes Relative 33 %   Lymphs Abs 1.9 0.7 - 4.0 K/uL   Monocytes Relative 6 %   Monocytes Absolute 0.4 0.1 - 1.0 K/uL   Eosinophils Relative 1 %   Eosinophils Absolute 0.0 0.0 - 0.5  K/uL   Basophils Relative 1 %   Basophils Absolute 0.0 0.0 - 0.1 K/uL   Immature Granulocytes 0 %   Abs Immature Granulocytes 0.02 0.00 - 0.07 K/uL    Comment: Performed at [facility] at [facility], [address] [place], [place] [address]  CMP ([place] only)     Status: Abnormal   Collection Time: 10/23/18  2:42 PM  Result  Value Ref Range   Sodium 135 135 - 145 mmol/L   Potassium 4.1 3.5 - 5.1 mmol/L   Chloride 99 98 - 111 mmol/L   CO2 30 22 - 32 mmol/L   Glucose, Bld 106 (H) 70 - 99 mg/dL   BUN 15 6 - 20 mg/dL   Creatinine 0.79 0.44 - 1.00 mg/dL   Calcium 9.9 8.9 - 10.3 mg/dL   Total Protein 7.9 6.5 - 8.1 g/dL   Albumin 4.5 3.5 - 5.0 g/dL   AST 14 (L) 15 - 41 U/L   ALT 13 0 - 44 U/L   Alkaline Phosphatase 34 (L) 38 - 126 U/L   Total Bilirubin 0.8 0.3 - 1.2 mg/dL   GFR, Est Non Af Am >60 >60 mL/min   GFR, Est AFR Am >60 >60 mL/min   Anion gap 6 5 - 15    Comment: Performed at [facility] at [facility], [address] [place], [place] [address]  Save Smear [facility])     Status: None   Collection Time: 10/23/18  2:42 PM  Result Value Ref Range   Smear Review SMEAR STAINED AND AVAILABLE FOR REVIEW     Comment: Performed at [facility] at [facility], [address], [place], [place] [address]  Reticulocytes     Status: Abnormal   Collection Time: 10/23/18  2:42 PM  Result Value Ref Range   Retic Ct Pct 1.5 0.4 - 3.1 %   RBC. 4.00 3.87 - 5.11 MIL/uL   Retic Count, Absolute 58.0 19.0 - 186.0 K/uL   Immature Retic Fract 24.6 (H) 2.3 - 15.9 %    Comment: Performed at [facility] at [facility], [address], [place], [place] [address]      RADIOGRAPHY: No results found.     PATHOLOGY: None    ASSESSMENT/PLAN: Ms. Janice Thomas is a very pleasant 43 yo African American female with iron deficiency anemia secondary to heavy cycles, multiple uterine fibroids. She  will continue her oral iron supplement for now. We will see what her lab work shows and convert her to IV iron if necessary.  Once we have her results in hand we will schedule a follow-up.   All questions were answered and she is in agreement with the plan. She will contact our office with any questions or concerns. We can certainly see her sooner if need be.   She was discussed with and also seen by Dr. Marin Olp and he is in agreement with the aforementioned.   Laverna Peace, NP    Addendum: I saw and examined the patient with .  I agree with her above assessment.  I looked at her blood smear under the microscope.  She has some hypochromic and microcytic red blood cells.  There is some anisocytosis.  White blood cells appeared normal.  There were no immature myeloid cells.  She had adequate platelets.  Her iron studies clearly are incredibly low.  She will definitely need IV iron.  I know she is taking oral iron.  However, given her very low levels, I am not sure how much more effective oral iron will be for her.  I know she has a heavy cycles and again she will deftly have problems not seen an erythropoietic response if we do not give her IV iron.  We will see about getting her in here for IV iron.  I think with  IV iron, we could definitely.  Blood count up to where it needs to be.  I think the MCV and her platelet count will help show [place] how her iron stores are doing.  We spent about 40 minutes with her today.  We talked about her lab work.  We answered all of her questions.  Again, I really think that IV iron is to be needed to try to get her iron levels back to where they need to be.   Lattie Haw, MD

## 2018-10-24 LAB — IRON AND TIBC
Iron: 16 ug/dL — ABNORMAL LOW (ref 41–142)
Saturation Ratios: 4 % — ABNORMAL LOW (ref 21–57)
TIBC: 378 ug/dL (ref 236–444)
UIBC: 362 ug/dL (ref 120–384)

## 2018-10-24 LAB — ERYTHROPOIETIN: Erythropoietin: 54.3 m[IU]/mL — ABNORMAL HIGH (ref 2.6–18.5)

## 2018-10-24 LAB — LACTATE DEHYDROGENASE: LDH: 172 U/L (ref 98–192)

## 2018-10-24 LAB — FERRITIN: Ferritin: <4 ng/mL — ABNORMAL LOW (ref 11–307)

## 2018-10-27 ENCOUNTER — Encounter: Payer: Self-pay | Admitting: Family

## 2018-10-27 DIAGNOSIS — N921 Excessive and frequent menstruation with irregular cycle: Secondary | ICD-10-CM

## 2018-10-27 DIAGNOSIS — D500 Iron deficiency anemia secondary to blood loss (chronic): Secondary | ICD-10-CM | POA: Insufficient documentation

## 2018-10-27 HISTORY — DX: Excessive and frequent menstruation with irregular cycle: N92.1

## 2018-10-27 LAB — HEMOGLOBINOPATHY EVALUATION
Hgb A2 Quant: 1.9 % (ref 1.8–3.2)
Hgb A: 98.1 % (ref 96.4–98.8)
Hgb C: 0 %
Hgb F Quant: 0 % (ref 0.0–2.0)
Hgb S Quant: 0 %
Hgb Variant: 0 %

## 2018-10-28 ENCOUNTER — Telehealth: Payer: Self-pay | Admitting: Family

## 2018-10-28 ENCOUNTER — Other Ambulatory Visit: Payer: Self-pay | Admitting: Family

## 2018-10-28 NOTE — Telephone Encounter (Signed)
Called and lmvm for patient with date/time of appointments.  I asked patient to call back to confirm appt per 6/16  los

## 2018-10-31 ENCOUNTER — Inpatient Hospital Stay: Payer: 59

## 2018-10-31 ENCOUNTER — Other Ambulatory Visit: Payer: Self-pay

## 2018-10-31 VITALS — BP 127/80 | HR 73 | Resp 18

## 2018-10-31 DIAGNOSIS — N920 Excessive and frequent menstruation with regular cycle: Secondary | ICD-10-CM | POA: Diagnosis not present

## 2018-10-31 DIAGNOSIS — D500 Iron deficiency anemia secondary to blood loss (chronic): Secondary | ICD-10-CM

## 2018-10-31 DIAGNOSIS — N921 Excessive and frequent menstruation with irregular cycle: Secondary | ICD-10-CM

## 2018-10-31 LAB — ALPHA-THALASSEMIA GENOTYPR

## 2018-10-31 MED ORDER — SODIUM CHLORIDE 0.9% FLUSH
10.0000 mL | Freq: Once | INTRAVENOUS | Status: DC | PRN
  Filled 2018-10-31: qty 10

## 2018-10-31 MED ORDER — SODIUM CHLORIDE 0.9 % IV SOLN
200.0000 mg | Freq: Once | INTRAVENOUS | Status: AC
  Administered 2018-10-31: 200 mg via INTRAVENOUS
  Filled 2018-10-31: qty 10

## 2018-10-31 MED ORDER — SODIUM CHLORIDE 0.9% FLUSH
3.0000 mL | Freq: Once | INTRAVENOUS | Status: DC | PRN
  Filled 2018-10-31: qty 10

## 2018-10-31 MED ORDER — SODIUM CHLORIDE 0.9 % IV SOLN
Freq: Once | INTRAVENOUS | Status: AC
  Administered 2018-10-31: 13:00:00 via INTRAVENOUS
  Filled 2018-10-31: qty 250

## 2018-10-31 NOTE — Patient Instructions (Signed)
Iron Sucrose injection What is this medicine? IRON SUCROSE (AHY ern SOO krohs) is an iron complex. Iron is used to make healthy red blood cells, which carry oxygen and nutrients throughout the body. This medicine is used to treat iron deficiency anemia in people with chronic kidney disease. This medicine may be used for other purposes; ask your health care provider or pharmacist if you have questions. COMMON BRAND NAME(S): Venofer What should I tell my health care provider before I take this medicine? They need to know if you have any of these conditions: -anemia not caused by low iron levels -heart disease -high levels of iron in the blood -kidney disease -liver disease -an unusual or allergic reaction to iron, other medicines, foods, dyes, or preservatives -pregnant or trying to get pregnant -breast-feeding How should I use this medicine? This medicine is for infusion into a vein. It is given by a health care professional in a hospital or clinic setting. Talk to your pediatrician regarding the use of this medicine in children. While this drug may be prescribed for children as young as 2 years for selected conditions, precautions do apply. Overdosage: If you think you have taken too much of this medicine contact a poison control center or emergency room at once. NOTE: This medicine is only for you. Do not share this medicine with others. What if I miss a dose? It is important not to miss your dose. Call your doctor or health care professional if you are unable to keep an appointment. What may interact with this medicine? Do not take this medicine with any of the following medications: -deferoxamine -dimercaprol -other iron products This medicine may also interact with the following medications: -chloramphenicol -deferasirox This list may not describe all possible interactions. Give your health care provider a list of all the medicines, herbs, non-prescription drugs, or dietary  supplements you use. Also tell them if you smoke, drink alcohol, or use illegal drugs. Some items may interact with your medicine. What should I watch for while using this medicine? Visit your doctor or healthcare professional regularly. Tell your doctor or healthcare professional if your symptoms do not start to get better or if they get worse. You may need blood work done while you are taking this medicine. You may need to follow a special diet. Talk to your doctor. Foods that contain iron include: whole grains/cereals, dried fruits, beans, or peas, leafy green vegetables, and organ meats (liver, kidney). What side effects may I notice from receiving this medicine? Side effects that you should report to your doctor or health care professional as soon as possible: -allergic reactions like skin rash, itching or hives, swelling of the face, lips, or tongue -breathing problems -changes in blood pressure -cough -fast, irregular heartbeat -feeling faint or lightheaded, falls -fever or chills -flushing, sweating, or hot feelings -joint or muscle aches/pains -seizures -swelling of the ankles or feet -unusually weak or tired Side effects that usually do not require medical attention (report to your doctor or health care professional if they continue or are bothersome): -diarrhea -feeling achy -headache -irritation at site where injected -nausea, vomiting -stomach upset -tiredness This list may not describe all possible side effects. Call your doctor for medical advice about side effects. You may report side effects to FDA at 1-800-FDA-1088. Where should I keep my medicine? This drug is given in a hospital or clinic and will not be stored at home. NOTE: This sheet is a summary. It may not cover all possible information. If   you have questions about this medicine, talk to your doctor, pharmacist, or health care provider.  2019 Elsevier/Gold Standard (2011-02-08 17:14:35)  

## 2018-11-07 ENCOUNTER — Inpatient Hospital Stay: Payer: 59

## 2018-11-07 ENCOUNTER — Other Ambulatory Visit: Payer: Self-pay

## 2018-11-07 VITALS — BP 122/68 | HR 80 | Temp 98.5°F | Resp 18

## 2018-11-07 DIAGNOSIS — N920 Excessive and frequent menstruation with regular cycle: Secondary | ICD-10-CM | POA: Diagnosis not present

## 2018-11-07 DIAGNOSIS — D500 Iron deficiency anemia secondary to blood loss (chronic): Secondary | ICD-10-CM

## 2018-11-07 DIAGNOSIS — N921 Excessive and frequent menstruation with irregular cycle: Secondary | ICD-10-CM

## 2018-11-07 MED ORDER — SODIUM CHLORIDE 0.9 % IV SOLN
200.0000 mg | Freq: Once | INTRAVENOUS | Status: AC
  Administered 2018-11-07: 200 mg via INTRAVENOUS
  Filled 2018-11-07: qty 10

## 2018-11-07 MED ORDER — SODIUM CHLORIDE 0.9 % IV SOLN
Freq: Once | INTRAVENOUS | Status: AC
  Administered 2018-11-07: 13:00:00 via INTRAVENOUS
  Filled 2018-11-07: qty 250

## 2018-11-07 NOTE — Patient Instructions (Signed)
Iron Sucrose injection What is this medicine? IRON SUCROSE (AHY ern SOO krohs) is an iron complex. Iron is used to make healthy red blood cells, which carry oxygen and nutrients throughout the body. This medicine is used to treat iron deficiency anemia in people with chronic kidney disease. This medicine may be used for other purposes; ask your health care provider or pharmacist if you have questions. COMMON BRAND NAME(S): Venofer What should I tell my health care provider before I take this medicine? They need to know if you have any of these conditions: -anemia not caused by low iron levels -heart disease -high levels of iron in the blood -kidney disease -liver disease -an unusual or allergic reaction to iron, other medicines, foods, dyes, or preservatives -pregnant or trying to get pregnant -breast-feeding How should I use this medicine? This medicine is for infusion into a vein. It is given by a health care professional in a hospital or clinic setting. Talk to your pediatrician regarding the use of this medicine in children. While this drug may be prescribed for children as young as 2 years for selected conditions, precautions do apply. Overdosage: If you think you have taken too much of this medicine contact a poison control center or emergency room at once. NOTE: This medicine is only for you. Do not share this medicine with others. What if I miss a dose? It is important not to miss your dose. Call your doctor or health care professional if you are unable to keep an appointment. What may interact with this medicine? Do not take this medicine with any of the following medications: -deferoxamine -dimercaprol -other iron products This medicine may also interact with the following medications: -chloramphenicol -deferasirox This list may not describe all possible interactions. Give your health care provider a list of all the medicines, herbs, non-prescription drugs, or dietary  supplements you use. Also tell them if you smoke, drink alcohol, or use illegal drugs. Some items may interact with your medicine. What should I watch for while using this medicine? Visit your doctor or healthcare professional regularly. Tell your doctor or healthcare professional if your symptoms do not start to get better or if they get worse. You may need blood work done while you are taking this medicine. You may need to follow a special diet. Talk to your doctor. Foods that contain iron include: whole grains/cereals, dried fruits, beans, or peas, leafy green vegetables, and organ meats (liver, kidney). What side effects may I notice from receiving this medicine? Side effects that you should report to your doctor or health care professional as soon as possible: -allergic reactions like skin rash, itching or hives, swelling of the face, lips, or tongue -breathing problems -changes in blood pressure -cough -fast, irregular heartbeat -feeling faint or lightheaded, falls -fever or chills -flushing, sweating, or hot feelings -joint or muscle aches/pains -seizures -swelling of the ankles or feet -unusually weak or tired Side effects that usually do not require medical attention (report to your doctor or health care professional if they continue or are bothersome): -diarrhea -feeling achy -headache -irritation at site where injected -nausea, vomiting -stomach upset -tiredness This list may not describe all possible side effects. Call your doctor for medical advice about side effects. You may report side effects to FDA at 1-800-FDA-1088. Where should I keep my medicine? This drug is given in a hospital or clinic and will not be stored at home. NOTE: This sheet is a summary. It may not cover all possible information. If   you have questions about this medicine, talk to your doctor, pharmacist, or health care provider.  2019 Elsevier/Gold Standard (2011-02-08 17:14:35)  

## 2018-12-09 ENCOUNTER — Inpatient Hospital Stay (HOSPITAL_BASED_OUTPATIENT_CLINIC_OR_DEPARTMENT_OTHER): Payer: 59 | Admitting: Family

## 2018-12-09 ENCOUNTER — Other Ambulatory Visit: Payer: Self-pay

## 2018-12-09 ENCOUNTER — Encounter: Payer: Self-pay | Admitting: Family

## 2018-12-09 ENCOUNTER — Inpatient Hospital Stay: Payer: 59 | Attending: Family

## 2018-12-09 VITALS — BP 137/69 | HR 69 | Temp 97.8°F | Resp 18 | Ht 65.0 in | Wt 168.0 lb

## 2018-12-09 DIAGNOSIS — D259 Leiomyoma of uterus, unspecified: Secondary | ICD-10-CM | POA: Diagnosis not present

## 2018-12-09 DIAGNOSIS — N921 Excessive and frequent menstruation with irregular cycle: Secondary | ICD-10-CM

## 2018-12-09 DIAGNOSIS — D500 Iron deficiency anemia secondary to blood loss (chronic): Secondary | ICD-10-CM | POA: Insufficient documentation

## 2018-12-09 DIAGNOSIS — N920 Excessive and frequent menstruation with regular cycle: Secondary | ICD-10-CM | POA: Diagnosis present

## 2018-12-09 DIAGNOSIS — Z79899 Other long term (current) drug therapy: Secondary | ICD-10-CM | POA: Insufficient documentation

## 2018-12-09 LAB — RETICULOCYTES
Immature Retic Fract: 11.6 % (ref 2.3–15.9)
RBC.: 4.49 MIL/uL (ref 3.87–5.11)
Retic Count, Absolute: 54.3 10*3/uL (ref 19.0–186.0)
Retic Ct Pct: 1.2 % (ref 0.4–3.1)

## 2018-12-09 LAB — CMP (CANCER CENTER ONLY)
ALT: 10 U/L (ref 0–44)
AST: 12 U/L — ABNORMAL LOW (ref 15–41)
Albumin: 4 g/dL (ref 3.5–5.0)
Alkaline Phosphatase: 29 U/L — ABNORMAL LOW (ref 38–126)
Anion gap: 7 (ref 5–15)
BUN: 11 mg/dL (ref 6–20)
CO2: 29 mmol/L (ref 22–32)
Calcium: 8.9 mg/dL (ref 8.9–10.3)
Chloride: 101 mmol/L (ref 98–111)
Creatinine: 0.73 mg/dL (ref 0.44–1.00)
GFR, Est AFR Am: >60 mL/min (ref >60)
GFR, Estimated: >60 mL/min (ref >60)
Glucose, Bld: 94 mg/dL (ref 70–99)
Potassium: 4.3 mmol/L (ref 3.5–5.1)
Sodium: 137 mmol/L (ref 135–145)
Total Bilirubin: 1 mg/dL (ref 0.3–1.2)
Total Protein: 7.3 g/dL (ref 6.5–8.1)

## 2018-12-09 LAB — CBC WITH DIFFERENTIAL (CANCER CENTER ONLY)
Abs Immature Granulocytes: 0.01 10*3/uL (ref 0.00–0.07)
Basophils Absolute: 0 10*3/uL (ref 0.0–0.1)
Basophils Relative: 1 %
Eosinophils Absolute: 0.1 10*3/uL (ref 0.0–0.5)
Eosinophils Relative: 1 %
HCT: 37.1 % (ref 36.0–46.0)
Hemoglobin: 11.7 g/dL — ABNORMAL LOW (ref 12.0–15.0)
Immature Granulocytes: 0 %
Lymphocytes Relative: 37 %
Lymphs Abs: 2 10*3/uL (ref 0.7–4.0)
MCH: 25.9 pg — ABNORMAL LOW (ref 26.0–34.0)
MCHC: 31.5 g/dL (ref 30.0–36.0)
MCV: 82.1 fL (ref 80.0–100.0)
Monocytes Absolute: 0.4 10*3/uL (ref 0.1–1.0)
Monocytes Relative: 7 %
Neutro Abs: 3 10*3/uL (ref 1.7–7.7)
Neutrophils Relative %: 54 %
Platelet Count: 303 10*3/uL (ref 150–400)
RBC: 4.52 MIL/uL (ref 3.87–5.11)
RDW: 18.5 % — ABNORMAL HIGH (ref 11.5–15.5)
WBC Count: 5.5 10*3/uL (ref 4.0–10.5)
nRBC: 0 % (ref 0.0–0.2)

## 2018-12-09 NOTE — Progress Notes (Signed)
Hematology and Oncology Follow Up Visit  Janice Thomas [id number] 19-Jul-1975 43 y.o. 12/09/2018   Principle Diagnosis:  Iron deficiency anemia secondary to heavy cycles  Current Therapy:   IV iron as indicated - Venofer   Interim History:  Janice Thomas is here today for follow-up. She is doing well and feeling much better since receiving IV iron in June.  She denies fatigue. She is walking in the evenings with her children for exercise.  Her cycle is regular and not heavy. No other blood loss noted. No bruising or petechiae.  No fever, chills, n/v, cough, rash, dizziness, SOB, chest pain, palpitations, abdominal pain or changes in bowel or bladder habits.  No swelling, tenderness, numbness or tingling in her extremities.  She is eating well and staying hydrated. Her weight is stable.   ECOG Performance Status: 1 - Symptomatic but completely ambulatory  Medications:  Allergies as of 12/09/2018   No Known Allergies     Medication List       Accurate as of December 09, 2018  3:19 PM. If you have any questions, ask your nurse or doctor.        GARLIC PO Take by mouth as needed.   MULTIVITAMIN PO Take by mouth daily.   norethindrone-ethinyl estradiol 1-20 MG-MCG tablet Commonly known as: LOESTRIN FE Take 1 tablet by mouth daily.   Vitamin D (Ergocalciferol) 1.25 MG (50000 UT) Caps capsule Commonly known as: DRISDOL Take 1 capsule (50,000 Units total) by mouth every 7 (seven) days.       Allergies: No Known Allergies  Past Medical History, Surgical history, Social history, and Family History were reviewed and updated.  Review of Systems: All other 10 point review of systems is negative.   Physical Exam:  vitals were not taken for this visit.   Wt Readings from Last 3 Encounters:  10/23/18 167 lb 1.9 oz (75.8 kg)  10/02/18 169 lb 3.2 oz (76.7 kg)  07/08/18 166 lb (75.3 kg)    Ocular: Sclerae unicteric, pupils equal, round and reactive to light Ear-nose-throat:  Oropharynx clear, dentition fair Lymphatic: No cervical or supraclavicular adenopathy Lungs no rales or rhonchi, good excursion bilaterally Heart regular rate and rhythm, no murmur appreciated Abd soft, nontender, positive bowel sounds, no liver or spleen tip palpated on exam, no fluid wave  MSK no focal spinal tenderness, no joint edema Neuro: non-focal, well-oriented, appropriate affect Breasts: Deferred   Lab Results  Component Value Date   WBC 5.5 12/09/2018   HGB 11.7 (L) 12/09/2018   HCT 37.1 12/09/2018   MCV 82.1 12/09/2018   PLT 303 12/09/2018   Lab Results  Component Value Date   FERRITIN <4 (L) 10/23/2018   IRON 16 (L) 10/23/2018   TIBC 378 10/23/2018   UIBC 362 10/23/2018   IRONPCTSAT 4 (L) 10/23/2018   Lab Results  Component Value Date   RETICCTPCT 1.2 12/09/2018   RBC 4.52 12/09/2018   No results found for: KPAFRELGTCHN, LAMBDASER, KAPLAMBRATIO No results found for: IGGSERUM, IGA, IGMSERUM No results found for: Ronnald Ramp, A1GS, A2GS, Violet Baldy MSPIKE, SPEI   Chemistry      Component Value Date/Time   NA 135 10/23/2018 1442   NA 135 07/08/2018 1354   K 4.1 10/23/2018 1442   CL 99 10/23/2018 1442   CO2 30 10/23/2018 1442   BUN 15 10/23/2018 1442   BUN 8 07/08/2018 1354   CREATININE 0.79 10/23/2018 1442   CREATININE 0.84 07/03/2016 1431  Component Value Date/Time   CALCIUM 9.9 10/23/2018 1442   ALKPHOS 34 (L) 10/23/2018 1442   AST 14 (L) 10/23/2018 1442   ALT 13 10/23/2018 1442   BILITOT 0.8 10/23/2018 1442       Impression and Plan: Janice Thomas is a very pleasant 43 yo African American female with iron deficiency anemia secondary to heavy cycles, multiple uterine fibroids. We will see what her iron studies show and bring her back in for infusion if needed.  We will plan to see her back in another 2 months.  She will contact our office with any questions or concerns. We can certainly see her sooner if needed.    Laverna Peace, NP 7/28/20203:19 PM

## 2018-12-10 LAB — IRON AND TIBC
Iron: 129 ug/dL (ref 41–142)
Saturation Ratios: 38 % (ref 21–57)
TIBC: 340 ug/dL (ref 236–444)
UIBC: 211 ug/dL (ref 120–384)

## 2018-12-10 LAB — FERRITIN: Ferritin: 12 ng/mL (ref 11–307)

## 2018-12-23 ENCOUNTER — Ambulatory Visit: Payer: 59 | Admitting: Physician Assistant

## 2018-12-30 ENCOUNTER — Other Ambulatory Visit: Payer: Self-pay | Admitting: Obstetrics and Gynecology

## 2018-12-30 NOTE — Telephone Encounter (Signed)
Medication refill request: Junel Last AEX:  07/08/2018 DL Next AEX: 07/10/2019 Last MMG (if hormonal medication request): 07/09/2018 BIRADS 1 Neg Refill authorized: Please advise, order pended for #28 with no refills if authorized.

## 2019-01-01 ENCOUNTER — Other Ambulatory Visit: Payer: Self-pay

## 2019-01-01 ENCOUNTER — Encounter: Payer: Self-pay | Admitting: Physician Assistant

## 2019-01-01 ENCOUNTER — Telehealth: Payer: Self-pay | Admitting: *Deleted

## 2019-01-01 ENCOUNTER — Ambulatory Visit (INDEPENDENT_AMBULATORY_CARE_PROVIDER_SITE_OTHER): Payer: 59 | Admitting: Physician Assistant

## 2019-01-01 ENCOUNTER — Ambulatory Visit (HOSPITAL_COMMUNITY)
Admission: RE | Admit: 2019-01-01 | Discharge: 2019-01-01 | Disposition: A | Discharge status: Home / Self Care | Payer: 59 | Source: Ambulatory Visit | Attending: Physician Assistant | Admitting: Physician Assistant

## 2019-01-01 VITALS — BP 130/80 | HR 82 | Temp 97.9°F | Ht 65.5 in | Wt 167.5 lb

## 2019-01-01 DIAGNOSIS — E785 Hyperlipidemia, unspecified: Secondary | ICD-10-CM | POA: Diagnosis not present

## 2019-01-01 DIAGNOSIS — M79662 Pain in left lower leg: Secondary | ICD-10-CM

## 2019-01-01 LAB — LIPID PANEL
Cholesterol: 200 mg/dL (ref 0–200)
HDL: 41.6 mg/dL (ref >39.00)
LDL Cholesterol: 134 mg/dL — ABNORMAL HIGH (ref 0–99)
NonHDL: 158.34
Total CHOL/HDL Ratio: 5
Triglycerides: 121 mg/dL (ref 0.0–149.0)
VLDL: 24.2 mg/dL (ref 0.0–40.0)

## 2019-01-01 LAB — BASIC METABOLIC PANEL
BUN: 10 mg/dL (ref 6–23)
CO2: 27 mEq/L (ref 19–32)
Calcium: 9.2 mg/dL (ref 8.4–10.5)
Chloride: 103 mEq/L (ref 96–112)
Creatinine, Ser: 0.81 mg/dL (ref 0.40–1.20)
GFR: 92.73 mL/min (ref >60.00)
Glucose, Bld: 93 mg/dL (ref 70–99)
Potassium: 4.1 mEq/L (ref 3.5–5.1)
Sodium: 137 mEq/L (ref 135–145)

## 2019-01-01 NOTE — Patient Instructions (Signed)
It was great to see you!  1. We will call you with your lab results and recommendations regarding your cholesterol.  2. Please go get the ultrasound for your leg as we have discussed. We will call you with results and soon as they have returned. If you develop any shortness of breath, palpitations or chest pain in the meantime, please go to the ER.  Take care,  Inda Coke PA-C

## 2019-01-01 NOTE — Progress Notes (Signed)
Janice Thomas is a 43 y.o. female here to Establish Care.  I acted as a Education administrator for [employer], PA-C Anselmo Pickler, LPN  History of Present Illness:   Chief Complaint  Patient presents with  . Establish Care  . Left calf pain    Acute Concerns: Calf pain Pt c/o left calf pain x 2 weeks off and on, mainly when sleeping. Denies personal hx of blood clot, but her mother did have one, while hospitalized. She is currently on OCPs. She does stand for 8 hours at a time at her job and walks as exercise regularly. Denies: Chest pain, shortness of breath, palpitations, swelling to calf, numbness or tingling to foot, recent immobilization.  Hyperlipidemia She was also referred to our office from her OB/GYN due to hyperlipidemia.  She has never been on cholesterol medication.  Her last lipid panel was done in April 2020.  Lipid Panel     Component Value Date/Time   CHOL 214 (H) 09/05/2018 0909   TRIG 110 09/05/2018 0909   HDL 43 09/05/2018 0909   CHOLHDL 5.0 (H) 09/05/2018 0909   CHOLHDL 5.0 (H) 10/03/2016 0928   VLDL 21 10/03/2016 0928   LDLCALC 149 (H) 09/05/2018 0909   The 10-year ASCVD risk score Mikey Bussing DC Jr., et al., 2013) is: 1.8%   Values used to calculate the score:     Age: 20 years     Sex: Female     Is Non-Hispanic African American: Yes     Diabetic: No     Tobacco smoker: No     Systolic Blood Pressure: 834 mmHg     Is BP treated: No     HDL Cholesterol: 43 mg/dL     Total Cholesterol: 214 mg/dL   Health Maintenance: Immunizations -- UTD, declines Flu vaccine Colonoscopy -- N/A Mammogram -- UTD, done 07/09/18 PAP -- Done 07/03/2016 NILM / HPV Neg Weight -- Weight: 167 lb 8 oz (76 kg)   Depression screen PHQ 2/9 01/01/2019  Decreased Interest 0  Down, Depressed, Hopeless 0  PHQ - 2 Score 0    No flowsheet data found.   Other providers/specialists: Patient Care Team: Inda Coke, [place] as PCP - General (Physician Assistant)   Past Medical History:   Diagnosis Date  . Abnormal Pap smear of cervix 12/11   LGSIL  . Hyperlipidemia   . Menometrorrhagia 10/27/2018  . Vaginal delivery 1998, 2003     Social History   Socioeconomic History  . Marital status: Single    Spouse name: Not on file  . Number of children: Not on file  . Years of education: Not on file  . Highest education level: Not on file  Occupational History  . Not on file  Social Needs  . Financial resource strain: Not on file  . Food insecurity    Worry: Not on file    Inability: Not on file  . Transportation needs    Medical: Not on file    Non-medical: Not on file  Tobacco Use  . Smoking status: Never Smoker  . Smokeless tobacco: Never Used  Substance and Sexual Activity  . Alcohol use: No  . Drug use: No  . Sexual activity: Yes    Partners: Male    Birth control/protection: Condom  Lifestyle  . Physical activity    Days per week: Not on file    Minutes per session: Not on file  . Stress: Not on file  Relationships  . Social connections  Talks on phone: Not on file    Gets together: Not on file    Attends religious service: Not on file    Active member of club or organization: Not on file    Attends meetings of clubs or organizations: Not on file    Relationship status: Not on file  . Intimate partner violence    Fear of current or ex partner: Not on file    Emotionally abused: Not on file    Physically abused: Not on file    Forced sexual activity: Not on file  Other Topics Concern  . Not on file  Social History Narrative  . Not on file    Past Surgical History:  Procedure Laterality Date  . COLPOSCOPY  1/12   mild dysplasia CIN1, HPV effect  . DILATION AND CURETTAGE OF UTERUS      Family History  Problem Relation Age of Onset  . Thyroid disease Mother   . Hypertension Mother   . Diabetes Mother   . Heart attack Mother   . Sickle cell anemia Sister   . Deep vein thrombosis Sister     No Known Allergies   Current  Medications:   Current Outpatient Medications:  .  acetaminophen (TYLENOL) 500 MG tablet, Take 500 mg by mouth as needed., Disp: , Rfl:  .  Ferrous Sulfate (IRON PO), Take 10 mg by mouth daily., Disp: , Rfl:  .  Multiple Vitamins-Minerals (MULTIVITAMIN PO), Take by mouth daily., Disp: , Rfl:  .  norethindrone-ethinyl estradiol (LOESTRIN FE) 1-20 MG-MCG tablet, Take 1 tablet by mouth daily., Disp: 3 Package, Rfl: 0   Review of Systems:   Review of Systems  Constitutional: Negative for chills, fever, malaise/fatigue and weight loss.  HENT: Negative for hearing loss, sinus pain and sore throat.   Eyes: Negative for blurred vision.  Respiratory: Negative for cough and shortness of breath.   Cardiovascular: Negative for chest pain, palpitations and leg swelling.  Gastrointestinal: Negative for abdominal pain, constipation, diarrhea, heartburn, nausea and vomiting.  Genitourinary: Negative for dysuria, frequency and urgency.  Musculoskeletal: Negative for back pain, myalgias and neck pain.  Skin: Negative for itching and rash.  Neurological: Negative for dizziness, tingling, seizures, loss of consciousness and headaches.  Endo/Heme/Allergies: Negative for polydipsia.  Psychiatric/Behavioral: Negative for depression. The patient is not nervous/anxious.   All other systems reviewed and are negative.   Vitals:   Vitals:   01/01/19 0924  BP: 130/80  Pulse: 82  Temp: 97.9 F (36.6 C)  TempSrc: Temporal  SpO2: 99%  Weight: 167 lb 8 oz (76 kg)  Height: 5' 5.5" (1.664 m)      Body mass index is 27.45 kg/m.  Physical Exam:   Physical Exam Vitals signs and nursing note reviewed.  Constitutional:      General: She is not in acute distress.    Appearance: She is well-developed. She is not ill-appearing or toxic-appearing.  HENT:     Head: Normocephalic and atraumatic.  Cardiovascular:     Rate and Rhythm: Normal rate and regular rhythm.     Pulses:          Dorsalis pedis pulses  are 2+ on the right side and 2+ on the left side.     Heart sounds: Normal heart sounds.  Pulmonary:     Effort: Pulmonary effort is normal. No accessory muscle usage or respiratory distress.     Breath sounds: Normal breath sounds.  Musculoskeletal:     Comments: Slight  TTP of medial L calf muscle. Negative Homan's sign. No appreciable swelling/warmth/redness.  Skin:    General: Skin is warm and dry.  Neurological:     Mental Status: She is alert.  Psychiatric:        Speech: Speech normal.        Behavior: Behavior is cooperative.     Assessment and Plan:   Karie was seen today for establish care and left calf pain.  Diagnoses and all orders for this visit:  Pain of left calf Likely muscle strain, but discussed with her that I cannot r/o DVT without further work-up. Vitals stable in office. Will also check lytes. Further recommendations based on lab and imaging results. Worsening precautions were advised in the interim. -     VAS [place] LOWER EXTREMITY VENOUS (DVT); Future -     Basic metabolic panel  Hyperlipidemia, unspecified hyperlipidemia type Repeat lipid panel today, update ASCVD and further recommendations based on lab results. -     Lipid panel  . Reviewed expectations re: course of current medical issues. . Discussed self-management of symptoms. . Outlined signs and symptoms indicating need for more acute intervention. . Patient verbalized understanding and all questions were answered. . See orders for this visit as documented in the electronic medical record. . Patient received an After-Visit Summary.  CMA or LPN served as scribe during this visit. History, Physical, and Plan performed by medical provider. The above documentation has been reviewed and is accurate and complete.   Inda Coke, PA-C

## 2019-01-01 NOTE — Telephone Encounter (Signed)
Janice Thomas from [facility] called and said pt is Negative for DVT in Left leg and they will let her go. Told her okay, I will let [name] know.

## 2019-01-01 NOTE — Telephone Encounter (Signed)
[name] notified. 

## 2019-01-05 ENCOUNTER — Telehealth: Payer: Self-pay | Admitting: Obstetrics and Gynecology

## 2019-01-05 NOTE — Telephone Encounter (Signed)
Patient canceled her upcoming lipid lab appointment 01/07/19. She did not wish to reschedule at this time.

## 2019-01-07 ENCOUNTER — Other Ambulatory Visit: Payer: 59

## 2019-01-26 NOTE — Telephone Encounter (Signed)
Please schedule patient follow up for pill and cycle check up with Dr Talbert Nan. She will need to be called

## 2019-01-26 NOTE — Telephone Encounter (Signed)
Please call patient regarding refill & appt

## 2019-01-26 NOTE — Telephone Encounter (Signed)
She needs to return for a f/u pill check and cycle f/u. Please schedule with me.

## 2019-01-26 NOTE — Telephone Encounter (Signed)
Patient will be called tomorrow & appt scheduled.

## 2019-01-27 NOTE — Telephone Encounter (Signed)
Left message for patient to call to schedule follow appointment of birth control pills.

## 2019-01-28 NOTE — Telephone Encounter (Signed)
Left message for patient to call to schedule appt

## 2019-02-02 NOTE — Telephone Encounter (Signed)
Letter sent to patient. Encounter closed.

## 2019-02-02 NOTE — Telephone Encounter (Signed)
Patient was called twice with no answer. A voicemail was left for patient to call to schedule follow up appt. No appt has been made. Please advise. Routing to Sumner Boast, MD

## 2019-02-16 ENCOUNTER — Ambulatory Visit (INDEPENDENT_AMBULATORY_CARE_PROVIDER_SITE_OTHER): Payer: 59 | Admitting: Obstetrics and Gynecology

## 2019-02-16 ENCOUNTER — Other Ambulatory Visit: Payer: Self-pay

## 2019-02-16 ENCOUNTER — Encounter: Payer: Self-pay | Admitting: Obstetrics and Gynecology

## 2019-02-16 VITALS — BP 120/82 | HR 72 | Temp 97.2°F | Wt 169.0 lb

## 2019-02-16 DIAGNOSIS — N920 Excessive and frequent menstruation with regular cycle: Secondary | ICD-10-CM | POA: Diagnosis not present

## 2019-02-16 DIAGNOSIS — D251 Intramural leiomyoma of uterus: Secondary | ICD-10-CM

## 2019-02-16 DIAGNOSIS — D250 Submucous leiomyoma of uterus: Secondary | ICD-10-CM

## 2019-02-16 DIAGNOSIS — D500 Iron deficiency anemia secondary to blood loss (chronic): Secondary | ICD-10-CM

## 2019-02-16 MED ORDER — NORETHINDRONE 0.35 MG PO TABS: 1.0000 | ORAL_TABLET | Freq: Every day | ORAL | 0 refills | Status: DC

## 2019-02-16 NOTE — Progress Notes (Signed)
GYNECOLOGY  VISIT   HPI: 43 y.o.   Single Black or African American Not Hispanic or Latino  female   [phone] with Patient's last menstrual period was 02/03/2019 (exact date).   here for recheck. Patient is not currently taking birth control.  The patient has a h/o a fibroid uterus, menorrhagia and anemia. Endometrial biopsy with scant, benign endometrium.  She has been seen by Hematology for iron transfusions.   She was started on OCP's in the summer, was on for one month. It did   GYNECOLOGIC HISTORY: Patient's last menstrual period was 02/03/2019 (exact date). Contraception: Condoms Menopausal hormone therapy: None        OB History    Gravida  3   Para  2   Term  2   Preterm      AB  1   Living  2     SAB  1   TAB      Ectopic      Multiple      Live Births  2              Patient Active Problem List   Diagnosis Date Noted  . Iron deficiency anemia due to chronic blood loss 10/27/2018  . Menometrorrhagia 10/27/2018  . Enlarged uterus 06/30/2014  . Submucous leiomyoma of uterus 06/30/2014  . Intramural leiomyoma of uterus 06/30/2014    Past Medical History:  Diagnosis Date  . Abnormal Pap smear of cervix 12/11   LGSIL  . Hyperlipidemia   . Menometrorrhagia 10/27/2018  . Vaginal delivery 1998, 2003    Past Surgical History:  Procedure Laterality Date  . COLPOSCOPY  1/12   mild dysplasia CIN1, HPV effect  . DILATION AND CURETTAGE OF UTERUS      Current Outpatient Medications  Medication Sig Dispense Refill  . acetaminophen (TYLENOL) 500 MG tablet Take 500 mg by mouth as needed.    . Ferrous Sulfate (IRON PO) Take 10 mg by mouth daily.    . Multiple Vitamins-Minerals (MULTIVITAMIN PO) Take by mouth daily.    Lenda Kelp FE 1/20 1-20 MG-MCG tablet TAKE 1 TABLET BY MOUTH EVERY DAY (Patient not taking: Reported on 02/16/2019) 28 tablet 3   No current facility-administered medications for this visit.      ALLERGIES: Patient has no known  allergies.  Family History  Problem Relation Age of Onset  . Thyroid disease Mother   . Hypertension Mother   . Diabetes Mother   . Heart attack Mother   . Sickle cell anemia Sister   . Deep vein thrombosis Sister     Social History   Socioeconomic History  . Marital status: Single    Spouse name: Not on file  . Number of children: Not on file  . Years of education: Not on file  . Highest education level: Not on file  Occupational History  . Not on file  Social Needs  . Financial resource strain: Not on file  . Food insecurity    Worry: Not on file    Inability: Not on file  . Transportation needs    Medical: Not on file    Non-medical: Not on file  Tobacco Use  . Smoking status: Never Smoker  . Smokeless tobacco: Never Used  Substance and Sexual Activity  . Alcohol use: No  . Drug use: No  . Sexual activity: Yes    Partners: Male    Birth control/protection: Condom  Lifestyle  . Physical activity  Days per week: Not on file    Minutes per session: Not on file  . Stress: Not on file  Relationships  . Social Herbalist on phone: Not on file    Gets together: Not on file    Attends religious service: Not on file    Active member of club or organization: Not on file    Attends meetings of clubs or organizations: Not on file    Relationship status: Not on file  . Intimate partner violence    Fear of current or ex partner: Not on file    Emotionally abused: Not on file    Physically abused: Not on file    Forced sexual activity: Not on file  Other Topics Concern  . Not on file  Social History Narrative  . Not on file    Review of Systems  Constitutional: Negative.   HENT: Negative.   Eyes: Negative.   Respiratory: Negative.   Cardiovascular: Negative.   Gastrointestinal: Negative.   Genitourinary: Negative.   Musculoskeletal: Negative.   Skin: Negative.   Neurological: Negative.   Endo/Heme/Allergies: Negative.    Psychiatric/Behavioral: Negative.     PHYSICAL EXAMINATION:    BP 120/82 (BP Location: Right Arm, Patient Position: Sitting, Cuff Size: Normal)   Pulse 72   Temp (!) 97.2 F (36.2 C) (Temporal)   Wt 169 lb (76.7 kg)   LMP 02/03/2019 (Exact Date)   BMI 27.70 kg/m     General appearance: alert, cooperative and appears stated age   ASSESSMENT Fibroid uterus with menorrhagia leading to anemia, help with OCP's but she worried about clotting.     PLAN Discussed option of the minipill, depo-provera, mirena IUD, uterine artery embolization, and TLH. Discussed TLH. Would like to try the minipill Will call in 3 months of the minipill, then f/u   An After Visit Summary was printed and given to the patient.  ~15 minutes face to face time of which over 50% was spent in counseling.

## 2019-05-07 ENCOUNTER — Other Ambulatory Visit: Payer: Self-pay | Admitting: Obstetrics and Gynecology

## 2019-05-11 NOTE — Telephone Encounter (Signed)
Medication refill request: Janice Thomas 0.35mg  #84  Last AEX: 07-08-18 Next AEX:07-10-19 but has 3 mo f/u 05-20-19 Last MMG (if hormonal medication request):07-09-18 [id number] Refill authorized: Please advise and refill if appropriate

## 2019-05-20 ENCOUNTER — Ambulatory Visit: Payer: 59 | Admitting: Obstetrics and Gynecology

## 2019-06-10 ENCOUNTER — Other Ambulatory Visit: Payer: Self-pay | Admitting: Obstetrics and Gynecology

## 2019-06-11 ENCOUNTER — Other Ambulatory Visit: Payer: Self-pay

## 2019-06-11 ENCOUNTER — Encounter: Payer: Self-pay | Admitting: Obstetrics and Gynecology

## 2019-06-11 ENCOUNTER — Ambulatory Visit (INDEPENDENT_AMBULATORY_CARE_PROVIDER_SITE_OTHER): Payer: 59 | Admitting: Obstetrics and Gynecology

## 2019-06-11 VITALS — BP 122/64 | HR 88 | Temp 98.1°F | Ht 65.0 in | Wt 168.0 lb

## 2019-06-11 DIAGNOSIS — D251 Intramural leiomyoma of uterus: Secondary | ICD-10-CM | POA: Diagnosis not present

## 2019-06-11 DIAGNOSIS — N920 Excessive and frequent menstruation with regular cycle: Secondary | ICD-10-CM | POA: Diagnosis not present

## 2019-06-11 DIAGNOSIS — D250 Submucous leiomyoma of uterus: Secondary | ICD-10-CM | POA: Diagnosis not present

## 2019-06-11 DIAGNOSIS — D508 Other iron deficiency anemias: Secondary | ICD-10-CM | POA: Diagnosis not present

## 2019-06-11 NOTE — Patient Instructions (Signed)
Tranexamic acid oral tablets What is this medicine? TRANEXAMIC ACID (TRAN ex AM ik AS id) slows down or stops blood clots from being broken down. This medicine is used to treat heavy monthly menstrual bleeding. This medicine may be used for other purposes; ask your health care provider or pharmacist if you have questions. COMMON BRAND NAME(S): Cyklokapron, Lysteda What should I tell my health care provider before I take this medicine? They need to know if you have any of these conditions:  bleeding in the brain  blood clotting problems  kidney disease  vision problems  an unusual allergic reaction to tranexamic acid, other medicines, foods, dyes, or preservatives  pregnant or trying to get pregnant  breast-feeding How should I use this medicine? Take this medicine by mouth with a glass of water. Follow the directions on the prescription label. Do not cut, crush, or chew this medicine. You can take it with or without food. If it upsets your stomach, take it with food. Take your medicine at regular intervals. Do not take it more often than directed. Do not stop taking except on your doctor's advice. Do not take this medicine until your period has started. Do not take it for more than 5 days in a row. Do not take this medicine when you do not have your period. Talk to your pediatrician regarding the use of this medicine in children. While this drug may be prescribed for female children as young as 71 years of age for selected conditions, precautions do apply. Overdosage: If you think you have taken too much of this medicine contact a poison control center or emergency room at once. NOTE: This medicine is only for you. Do not share this medicine with others. What if I miss a dose? If you miss a dose, take it when you remember, and then take your next dose at least 6 hours later. Do not take more than 2 tablets at a time to make up for missed doses. What may interact with this medicine? Do  not take this medicine with any of the following medications:  estrogens  birth control pills, patches, injections, rings or other devices that contain both an estrogen and a progestin This medicine may also interact with the following medications:  certain medicines used to help your blood clot  tretinoin (taken by mouth) This list may not describe all possible interactions. Give your health care provider a list of all the medicines, herbs, non-prescription drugs, or dietary supplements you use. Also tell them if you smoke, drink alcohol, or use illegal drugs. Some items may interact with your medicine. What should I watch for while using this medicine? Tell your doctor or healthcare professional if your symptoms do not start to get better or if they get worse. Tell your doctor or healthcare professional if you notice any eye problems while taking this medicine. Your doctor will refer you to an eye doctor who will examine your eyes. What side effects may I notice from receiving this medicine? Side effects that you should report to your doctor or health care professional as soon as possible:  allergic reactions like skin rash, itching or hives, swelling of the face, lips, or tongue  breathing difficulties  changes in vision  sudden or severe pain in the chest, legs, head, or groin  unusually weak or tired Side effects that usually do not require medical attention (report to your doctor or health care professional if they continue or are bothersome):  back pain  headache  muscle or joint aches  sinus and nasal problems  stomach pain  tiredness This list may not describe all possible side effects. Call your doctor for medical advice about side effects. You may report side effects to FDA at 1-800-FDA-1088. Where should I keep my medicine? Keep out of the reach of children. Store at room temperature between 15 and 30 degrees C (59 and 86 degrees F). Throw away any unused  medicine after the expiration date. NOTE: This sheet is a summary. It may not cover all possible information. If you have questions about this medicine, talk to your doctor, pharmacist, or health care provider.  2020 Elsevier/Gold Standard (2015-06-02 09:12:15) Hysterectomy Information  A hysterectomy is a surgery in which the uterus is removed. The fallopian tubes and ovaries may be removed (bilateral salpingo-oophorectomy) as well. This procedure may be done to treat various medical problems. After the procedure, a woman will no longer have menstrual periods nor will she be able to become pregnant (sterile). What are the reasons for a hysterectomy? There are many reasons why a woman might have this procedure. They include:  Persistent, abnormal vaginal bleeding.  Long-term (chronic) pelvic pain or infection.  Endometriosis. This is when the lining of the uterus (endometrium) starts to grow outside the uterus.  Adenomyosis. This is when the endometrium starts to grow in the muscle of the uterus.  Pelvic organ prolapse. This is a condition in which the uterus falls down into the vagina.  Noncancerous growths in the uterus (uterine fibroids) that cause symptoms.  The presence of precancerous cells.  Cervical or uterine cancer. What are the different types of hysterectomy? There are three different types of hysterectomy:  Supracervical hysterectomy. In this type, the top part of the uterus is removed, but not the cervix.  Total hysterectomy. In this type, the uterus and cervix are removed.  Radical hysterectomy. In this type, the uterus, the cervix, and the tissue that holds the uterus in place (parametrium) are removed. What are the different ways a hysterectomy can be performed? There are many different ways a hysterectomy can be performed, including:  Abdominal hysterectomy. In this type, an incision is made in the abdomen. The uterus is removed through this incision.  Vaginal  hysterectomy. In this type, an incision is made in the vagina. The uterus is removed through this incision. There are no abdominal incisions.  Conventional laparoscopic hysterectomy. In this type, three or four small incisions are made in the abdomen. A thin, lighted tube with a camera (laparoscope) is inserted into one of the incisions. Other tools are put through the other incisions. The uterus is cut into small pieces. The small pieces are removed through the incisions or through the vagina.  Laparoscopically assisted vaginal hysterectomy (LAVH). In this type, three or four small incisions are made in the abdomen. Part of the surgery is performed laparoscopically and the other part is done vaginally. The uterus is removed through the vagina.  Robot-assisted laparoscopic hysterectomy. In this type, a laparoscope and other tools are inserted into three or four small incisions in the abdomen. A computer-controlled device is used to give the surgeon a 3D image and to help control the surgical instruments. This allows for more precise movements of surgical instruments. The uterus is cut into small pieces and removed through the incisions or removed through the vagina. Discuss the options with your health care provider to determine which type is the right one for you. What are the risks? Generally, this  is a safe procedure. However, problems may occur, including:  Bleeding and risk of blood transfusion. Tell your health care provider if you do not want to receive any blood products.  Blood clots in the legs or lung.  Infection.  Damage to other structures or organs.  Allergic reactions to medicines.  Changing to an abdominal hysterectomy from one of the other techniques. What to expect after a hysterectomy  You will be given pain medicine.  You may need to stay in the hospital for 1- 2 days to recover, depending on the type of hysterectomy you had.  Follow your health care provider's  instructions about exercise, driving, and general activities. Ask your health care provider what activities are safe for you.  You will need to have someone with you for the first 3-5 days after you go home.  You will need to follow up with your surgeon in 2-4 weeks after surgery to evaluate your progress.  If the ovaries are removed, you will have early menopause symptoms such as hot flashes, night sweats, and insomnia.  If you had a hysterectomy for a problem that was not cancer or not a condition that could lead to cancer, then you no longer need Pap tests. However, even if you no longer need a Pap test, a regular pelvic exam is a good idea to make sure no other problems are developing. Questions to ask your health care provider  Is a hysterectomy medically necessary? Do I have other treatment options for my condition?  What are my options for hysterectomy procedure?  What organs and tissues need to be removed?  What are the risks?  What are the benefits?  How long will I need to stay in the hospital after the procedure?  How long will I need to recover at home?  What symptoms can I expect after the procedure? Summary  A hysterectomy is a surgery in which the uterus is removed. The fallopian tubes and ovaries may be removed (bilateral salpingo-oophorectomy) as well.  This procedure may be done to treat various medical problems. After the procedure, a woman will no longer have menstrual periods nor will she be able to become pregnant.  Discuss the options with your health care provider to determine which type of hysterectomy is the right one for you. This information is not intended to replace advice given to you by your health care provider. Make sure you discuss any questions you have with your health care provider. Document Revised: 04/12/2017 Document Reviewed: 06/06/2016 Elsevier Patient Education  2020 [employer].

## 2019-06-11 NOTE — Progress Notes (Signed)
GYNECOLOGY  VISIT   HPI: 44 y.o.   Single Black or African American Not Hispanic or Latino  female   [phone] with Patient's last menstrual period was 06/03/2019 (exact date).   here for follow up birthcontrol she states that with the new birthcontrol she has a long very heavy period.  She is currently still on her period. She does not feel like this new treatment is working.  She has a h/o a fibroid uterus, menorrhagia and anemia. Endometrial biopsy in 5/20 returned with scant benign endometrium. Ultrasound with multiple myomas, slight deviation of endometrium.  She had some improvement in her cycles on OCP's, but was concerned about side effects. We discussed other options of treatment and started her on the minipill.  Cycles have been monthly x 8 days, heavy the whole time (prior to the pill it was just heavy x 2-3 days). She can saturate an overnight bad in <30 minutes.   GYNECOLOGIC HISTORY: Patient's last menstrual period was 06/03/2019 (exact date). Contraception:OCP, condoms. Menopausal hormone therapy: none         OB History    Gravida  3   Para  2   Term  2   Preterm      AB  1   Living  2     SAB  1   TAB      Ectopic      Multiple      Live Births  2              Patient Active Problem List   Diagnosis Date Noted  . Iron deficiency anemia due to chronic blood loss 10/27/2018  . Menometrorrhagia 10/27/2018  . Enlarged uterus 06/30/2014  . Submucous leiomyoma of uterus 06/30/2014  . Intramural leiomyoma of uterus 06/30/2014    Past Medical History:  Diagnosis Date  . Abnormal Pap smear of cervix 12/11   LGSIL  . Hyperlipidemia   . Menometrorrhagia 10/27/2018  . Vaginal delivery 1998, 2003    Past Surgical History:  Procedure Laterality Date  . COLPOSCOPY  1/12   mild dysplasia CIN1, HPV effect  . DILATION AND CURETTAGE OF UTERUS      Current Outpatient Medications  Medication Sig Dispense Refill  . acetaminophen (TYLENOL) 500 MG tablet  Take 500 mg by mouth as needed.    . Ferrous Sulfate (IRON PO) Take 10 mg by mouth daily.    . INCASSIA 0.35 MG tablet TAKE 1 TABLET BY MOUTH EVERY DAY 28 tablet 0  . Multiple Vitamins-Minerals (MULTIVITAMIN PO) Take by mouth daily.     No current facility-administered medications for this visit.     ALLERGIES: Patient has no known allergies.  Family History  Problem Relation Age of Onset  . Thyroid disease Mother   . Hypertension Mother   . Diabetes Mother   . Heart attack Mother   . Sickle cell anemia Sister   . Deep vein thrombosis Sister     Social History   Socioeconomic History  . Marital status: Single    Spouse name: Not on file  . Number of children: Not on file  . Years of education: Not on file  . Highest education level: Not on file  Occupational History  . Not on file  Tobacco Use  . Smoking status: Never Smoker  . Smokeless tobacco: Never Used  Substance and Sexual Activity  . Alcohol use: No  . Drug use: No  . Sexual activity: Yes    Partners: Male  Birth control/protection: Condom  Other Topics Concern  . Not on file  Social History Narrative  . Not on file   Social Determinants of Health   Financial Resource Strain:   . Difficulty of Paying Living Expenses: Not on file  Food Insecurity:   . Worried About Charity fundraiser in the Last Year: Not on file  . Ran Out of Food in the Last Year: Not on file  Transportation Needs:   . Lack of Transportation (Medical): Not on file  . Lack of Transportation (Non-Medical): Not on file  Physical Activity:   . Days of Exercise per Week: Not on file  . Minutes of Exercise per Session: Not on file  Stress:   . Feeling of Stress : Not on file  Social Connections:   . Frequency of Communication with Friends and Family: Not on file  . Frequency of Social Gatherings with Friends and Family: Not on file  . Attends Religious Services: Not on file  . Active Member of Clubs or Organizations: Not on file  .  Attends Archivist Meetings: Not on file  . Marital Status: Not on file  Intimate Partner Violence:   . Fear of Current or Ex-Partner: Not on file  . Emotionally Abused: Not on file  . Physically Abused: Not on file  . Sexually Abused: Not on file    Review of Systems  All other systems reviewed and are negative.   PHYSICAL EXAMINATION:    BP 122/64   Pulse 88   Temp 98.1 F (36.7 C)   Ht 5\' 5"  (1.651 m)   Wt 168 lb (76.2 kg)   LMP 06/03/2019 (Exact Date) Comment: Patient is still having her period  SpO2 99%   BMI 27.96 kg/m     General appearance: alert, cooperative and appears stated age  ASSESSMENT Menorrhagia, the mini-pill isn't working.  Didn't want to be on OCP's.  Fibroid uterus H/O anemia, has required iron transfusions in the past.    PLAN Stop the mini-pill Discussed options for treatment, including: depo-provera, mirena IUD, lysteda, uterine artery embolization and TLH/BS. Reviewed risks and information given CBC, Ferritin  Discussed that with her level of bleeding she really needs to do something, she could get dangerously anemic.     An After Visit Summary was printed and given to the patient.  ~20 minutes in total patient care

## 2019-06-12 LAB — CBC
Hematocrit: 35.7 % (ref 34.0–46.6)
Hemoglobin: 11.6 g/dL (ref 11.1–15.9)
MCH: 28.2 pg (ref 26.6–33.0)
MCHC: 32.5 g/dL (ref 31.5–35.7)
MCV: 87 fL (ref 79–97)
Platelets: 314 10*3/uL (ref 150–450)
RBC: 4.11 x10E6/uL (ref 3.77–5.28)
RDW: 13 % (ref 11.7–15.4)
WBC: 5 10*3/uL (ref 3.4–10.8)

## 2019-06-12 LAB — FERRITIN: Ferritin: 9 ng/mL — ABNORMAL LOW (ref 15–150)

## 2019-06-29 ENCOUNTER — Telehealth: Payer: Self-pay | Admitting: Certified Nurse Midwife

## 2019-06-29 NOTE — Telephone Encounter (Signed)
Left message regarding upcoming appointment has been canceled and needs to be rescheduled. °

## 2019-07-10 ENCOUNTER — Ambulatory Visit: Payer: 59 | Admitting: Certified Nurse Midwife

## 2019-07-15 ENCOUNTER — Encounter: Payer: Self-pay | Admitting: Certified Nurse Midwife

## 2019-07-24 ENCOUNTER — Other Ambulatory Visit: Payer: Self-pay

## 2019-07-27 ENCOUNTER — Other Ambulatory Visit (HOSPITAL_COMMUNITY)
Admission: RE | Admit: 2019-07-27 | Discharge: 2019-07-27 | Disposition: A | Discharge status: Home / Self Care | Payer: 59 | Source: Ambulatory Visit | Attending: Obstetrics & Gynecology | Admitting: Obstetrics & Gynecology

## 2019-07-27 ENCOUNTER — Ambulatory Visit (INDEPENDENT_AMBULATORY_CARE_PROVIDER_SITE_OTHER): Payer: 59 | Admitting: Certified Nurse Midwife

## 2019-07-27 ENCOUNTER — Other Ambulatory Visit: Payer: Self-pay

## 2019-07-27 ENCOUNTER — Encounter: Payer: Self-pay | Admitting: Certified Nurse Midwife

## 2019-07-27 VITALS — BP 120/70 | HR 70 | Temp 97.9°F | Resp 16 | Ht 64.5 in | Wt 170.0 lb

## 2019-07-27 DIAGNOSIS — Z86018 Personal history of other benign neoplasm: Secondary | ICD-10-CM

## 2019-07-27 DIAGNOSIS — Z124 Encounter for screening for malignant neoplasm of cervix: Secondary | ICD-10-CM | POA: Insufficient documentation

## 2019-07-27 DIAGNOSIS — N852 Hypertrophy of uterus: Secondary | ICD-10-CM

## 2019-07-27 DIAGNOSIS — Z01419 Encounter for gynecological examination (general) (routine) without abnormal findings: Secondary | ICD-10-CM

## 2019-07-27 NOTE — Progress Notes (Signed)
44 y.o. [id number] Single  African American Fe here for annual exam. Periods monthly with  6-7 days and heavy at times and  some cramping. Took OCP for short period of time to try to control heavy bleeding, but patient felt it was worse and stopped.  Contraception condoms working well. No partner change or STD screening needed.Arita Miss for labs and aex recently and all normal per patient. Patient feels her abdomen has more protruding appearance, so not sure if fibroids have changed.Slight weight gain, but working on better diet. No other health concerns today.  Patient's last menstrual period was 07/06/2019 (exact date).          Sexually active: Yes.    The current method of family planning is condoms all the time.    Exercising: Yes.    walking Smoker:  no  Review of Systems  Constitutional: Negative.   HENT: Negative.   Eyes: Negative.   Respiratory: Negative.   Cardiovascular: Negative.   Gastrointestinal: Negative.   Genitourinary: Negative.   Musculoskeletal: Negative.   Skin: Negative.   Neurological: Negative.   Endo/Heme/Allergies: Negative.   Psychiatric/Behavioral: Negative.     Health Maintenance: Pap:  07-03-16 neg HPV HR neg History of Abnormal Pap: yes, yrs ago per patient MMG:  06/2019 neg per patient Self Breast exams: yes Colonoscopy:  none BMD:   none TDaP:  2016 Shingles: no Pneumonia: no Hep C and HIV: Hep c neg 2016, HIV neg 2015 Labs:  If needed   reports that she has never smoked. She has never used smokeless tobacco. She reports that she does not drink alcohol or use drugs.  Past Medical History:  Diagnosis Date  . Abnormal Pap smear of cervix 12/11   LGSIL  . Hyperlipidemia   . Menometrorrhagia 10/27/2018  . Vaginal delivery 1998, 2003    Past Surgical History:  Procedure Laterality Date  . COLPOSCOPY  1/12   mild dysplasia CIN1, HPV effect  . DILATION AND CURETTAGE OF UTERUS      Current Outpatient Medications  Medication Sig  Dispense Refill  . acetaminophen (TYLENOL) 500 MG tablet Take 500 mg by mouth as needed.    . Ferrous Sulfate (IRON PO) Take 10 mg by mouth daily.    . Multiple Vitamins-Minerals (MULTIVITAMIN PO) Take by mouth daily.     No current facility-administered medications for this visit.    Family History  Problem Relation Age of Onset  . Thyroid disease Mother   . Hypertension Mother   . Diabetes Mother   . Heart attack Mother   . Sickle cell anemia Sister   . Deep vein thrombosis Sister     ROS:  Pertinent items are noted in HPI.  Otherwise, a comprehensive ROS was negative.  Exam:   BP 120/70   Pulse 70   Temp 97.9 F (36.6 C) (Skin)   Resp 16   Ht 5' 4.5" (1.638 m)   Wt 170 lb (77.1 kg)   LMP 07/06/2019 (Exact Date)   BMI 28.73 kg/m  Height: 5' 4.5" (163.8 cm) Ht Readings from Last 3 Encounters:  07/27/19 5' 4.5" (1.638 m)  06/11/19 5\' 5"  (1.651 m)  01/01/19 5' 5.5" (1.664 m)    General appearance: alert, cooperative and appears stated age Head: Normocephalic, without obvious abnormality, atraumatic Neck: no adenopathy, supple, symmetrical, trachea midline and thyroid normal to inspection and palpation Lungs: clear to auscultation bilaterally Breasts: normal appearance, no masses or tenderness, No nipple retraction or dimpling, No  nipple discharge or bleeding, No axillary or supraclavicular adenopathy Heart: regular rate and rhythm Abdomen: soft, non-tender; no masses,  no organomegaly Extremities: extremities normal, atraumatic, no cyanosis or edema Skin: Skin color, texture, turgor normal. No rashes or lesions Lymph nodes: Cervical, supraclavicular, and axillary nodes normal. No abnormal inguinal nodes palpated Neurologic: Grossly normal   Pelvic: External genitalia:  no lesions              Urethra:  normal appearing urethra with no masses, tenderness or lesions              Bartholin's and Skene's: normal                 Vagina: normal appearing vagina with  normal color and discharge, no lesions              Cervix: multiparous appearance, no cervical motion tenderness, no lesions and difficult acess due fibroid uterus              Pap taken: Yes.   Bimanual Exam:  Uterus:  enlarged, 20-22 week size, 2 fb above umbilicus weeks size              Adnexa: unable to palpate due to enlarged uterus               Rectovaginal: Confirms               Anus:  normal sphincter tone, no lesions  Chaperone present: yes  A:  Well Woman with normal exam  Enlarged uterus known fibroids with size change,symptomatic with pelvic pressure  Menorrhagia decrease with stopping OCP(per patient). Continues iron supplementation for history of anemia.  P:   Reviewed health and wellness pertinent to exam  Discussed uterine size change from previous exam notes and feel this should be evaluated again with PUS. Discussed possible treatment with surgery. Patient has felt heavier and abdomen more pregnant appearing. Patient agreeable to PUS. She will be called with appointment and insurance information and scheduled.  Continue iron supplement as recommended.  Pap smear: yes  counseled on breast self exam, mammography screening, feminine hygiene, adequate intake of calcium and vitamin D, diet and exercise, STD prevention, colonoscopy screening between 45-50.  return annually or prn  An After Visit Summary was printed and given to the patient.

## 2019-07-27 NOTE — Patient Instructions (Signed)
EXERCISE AND DIET:  We recommended that you start or continue a regular exercise program for good health. Regular exercise means any activity that makes your heart beat faster and makes you sweat.  We recommend exercising at least 30 minutes per day at least 3 days a week, preferably 4 or 5.  We also recommend a diet low in fat and sugar.  Inactivity, poor dietary choices and obesity can cause diabetes, heart attack, stroke, and kidney damage, among others.    ALCOHOL AND SMOKING:  Women should limit their alcohol intake to no more than 7 drinks/beers/glasses of wine (combined, not each!) per week. Moderation of alcohol intake to this level decreases your risk of breast cancer and liver damage. And of course, no recreational drugs are part of a healthy lifestyle.  And absolutely no smoking or even second hand smoke. Most people know smoking can cause heart and lung diseases, but did you know it also contributes to weakening of your bones? Aging of your skin?  Yellowing of your teeth and nails?  CALCIUM AND VITAMIN D:  Adequate intake of calcium and Vitamin D are recommended.  The recommendations for exact amounts of these supplements seem to change often, but generally speaking 600 mg of calcium (either carbonate or citrate) and 800 units of Vitamin D per day seems prudent. Certain women may benefit from higher intake of Vitamin D.  If you are among these women, your doctor will have told you during your visit.    PAP SMEARS:  Pap smears, to check for cervical cancer or precancers,  have traditionally been done yearly, although recent scientific advances have shown that most women can have pap smears less often.  However, every woman still should have a physical exam from her gynecologist every year. It will include a breast check, inspection of the vulva and vagina to check for abnormal growths or skin changes, a visual exam of the cervix, and then an exam to evaluate the size and shape of the uterus and  ovaries.  And after 45 years of age, a rectal exam is indicated to check for rectal cancers. We will also provide age appropriate advice regarding health maintenance, like when you should have certain vaccines, screening for sexually transmitted diseases, bone density testing, colonoscopy, mammograms, etc.   MAMMOGRAMS:  All women over 45 years old should have a yearly mammogram. Many facilities now offer a "3D" mammogram, which may cost around $50 extra out of pocket. If possible,  we recommend you accept the option to have the 3D mammogram performed.  It both reduces the number of women who will be called back for extra views which then turn out to be normal, and it is better than the routine mammogram at detecting truly abnormal areas.    COLONOSCOPY:  Colonoscopy to screen for colon cancer is recommended for all women at age 45.  We know, you hate the idea of the prep.  We agree, BUT, having colon cancer and not knowing it is worse!!  Colon cancer so often starts as a polyp that can be seen and removed at colonscopy, which can quite literally save your life!  And if your first colonoscopy is normal and you have no family history of colon cancer, most women don't have to have it again for 10 years.  Once every ten years, you can do something that may end up saving your life, right?  We will be happy to help you get it scheduled when you are ready.    Be sure to check your insurance coverage so you understand how much it will cost.  It may be covered as a preventative service at no cost, but you should check your particular policy.      Uterine Fibroids  Uterine fibroids (leiomyomas) are noncancerous (benign) tumors that can develop in the uterus. Fibroids may also develop in the fallopian tubes, cervix, or tissues (ligaments) near the uterus. You may have one or many fibroids. Fibroids vary in size, weight, and where they grow in the uterus. Some can become quite large. Most fibroids do not require  medical treatment. What are the causes? The cause of this condition is not known. What increases the risk? You are more likely to develop this condition if you:  Are in your 30s or 40s and have not gone through menopause.  Have a family history of this condition.  Are of African-American descent.  Had your first period at an early age (early menarche).  Have not had any children (nulliparity).  Are overweight or obese. What are the signs or symptoms? Many women do not have any symptoms. Symptoms of this condition may include:  Heavy menstrual bleeding.  Bleeding or spotting between periods.  Pain and pressure in the pelvic area, between the hips.  Bladder problems, such as needing to urinate urgently or more often than usual.  Inability to have children (infertility).  Failure to carry pregnancy to term (miscarriage). How is this diagnosed? This condition may be diagnosed based on:  Your symptoms and medical history.  A physical exam.  A pelvic exam that includes feeling for any tumors.  Imaging tests, such as ultrasound or MRI. How is this treated? Treatment for this condition may include:  Seeing your health care provider for follow-up visits to monitor your fibroids for any changes.  Taking NSAIDs such as ibuprofen, naproxen, or aspirin to reduce pain.  Hormone medicines. These may be taken as a pill, given in an injection, or delivered by a T-shaped device that is inserted into the uterus (intrauterine device, IUD).  Surgery to remove one of the following: ? The fibroids (myomectomy). Your health care provider may recommend this if fibroids affect your fertility and you want to become pregnant. ? The uterus (hysterectomy). ? Blood supply to the fibroids (uterine artery embolization). Follow these instructions at home:  Take over-the-counter and prescription medicines only as told by your health care provider.  Ask your health care provider if you should  take iron pills or eat more iron-rich foods, such as dark green, leafy vegetables. Heavy menstrual bleeding can cause low iron levels.  If directed, apply heat to your back or abdomen to reduce pain. Use the heat source that your health care provider recommends, such as a moist heat pack or a heating pad. ? Place a towel between your skin and the heat source. ? Leave the heat on for 20-30 minutes. ? Remove the heat if your skin turns bright red. This is especially important if you are unable to feel pain, heat, or cold. You may have a greater risk of getting burned.  Pay close attention to your menstrual cycle. Tell your health care provider about any changes, such as: ? Increased blood flow that requires you to use more pads or tampons than usual. ? A change in the number of days that your period lasts. ? A change in symptoms that are associated with your period, such as back pain or cramps in your abdomen.  Keep all follow-up visits  as told by your health care provider. This is important, especially if your fibroids need to be monitored for any changes. Contact a health care provider if you:  Have pelvic pain, back pain, or cramps in your abdomen that do not get better with medicine or heat.  Develop new bleeding between periods.  Have increased bleeding during or between periods.  Feel unusually tired or weak.  Feel light-headed. Get help right away if you:  Faint.  Have pelvic pain that suddenly gets worse.  Have severe vaginal bleeding that soaks a tampon or pad in 30 minutes or less. Summary  Uterine fibroids are noncancerous (benign) tumors that can develop in the uterus.  The exact cause of this condition is not known.  Most fibroids do not require medical treatment unless they affect your ability to have children (fertility).  Contact a health care provider if you have pelvic pain, back pain, or cramps in your abdomen that do not get better with medicines.  Make sure  you know what symptoms should cause you to get help right away. This information is not intended to replace advice given to you by your health care provider. Make sure you discuss any questions you have with your health care provider. Document Revised: 04/12/2017 Document Reviewed: 03/26/2017 Elsevier Patient Education  2020 [employer].

## 2019-07-28 ENCOUNTER — Telehealth: Payer: Self-pay | Admitting: Obstetrics and Gynecology

## 2019-07-28 ENCOUNTER — Telehealth: Payer: Self-pay | Admitting: *Deleted

## 2019-07-28 DIAGNOSIS — N852 Hypertrophy of uterus: Secondary | ICD-10-CM

## 2019-07-28 DIAGNOSIS — Z86018 Personal history of other benign neoplasm: Secondary | ICD-10-CM

## 2019-07-28 NOTE — Telephone Encounter (Signed)
-----   Message from Regina Eck, CNM sent at 07/27/2019  8:35 PM EDT ----- Patient has history of fibroids but size change from 14-15 week size to 20 to 24 week size. Needs PUS to evaluate and visualize ovaries unable to palpateShe is aware she will be called with appointment and insurance information

## 2019-07-28 NOTE — Telephone Encounter (Signed)
Left message to call Sharee Pimple, RN at [facility].   PUS order placed.   Please schedule with Dr. Talbert Nan.

## 2019-07-28 NOTE — Telephone Encounter (Signed)
Call placed to convey benefits for ultrasound. Spoke with the patient and conveyed the benefits. Patient understands/agreeable with the benefits. Appointment scheduled 08/04/19.

## 2019-07-28 NOTE — Telephone Encounter (Signed)
Spoke with patient. PUS scheduled for 08/04/19 at 2pm, consult to follow at 2:30pm with Dr. Talbert Nan. Patient verbalizes understanding and is agreeable.   Routing to provider for final review. Patient is agreeable to disposition. Will close encounter.  Cc: Dr. Talbert Nan, [facility] [place], [place]

## 2019-07-28 NOTE — Telephone Encounter (Signed)
Patient returned call

## 2019-07-30 LAB — CYTOLOGY - PAP
Comment: NEGATIVE
Diagnosis: NEGATIVE
High risk HPV: NEGATIVE

## 2019-07-31 ENCOUNTER — Encounter: Payer: Self-pay | Admitting: Certified Nurse Midwife

## 2019-08-03 ENCOUNTER — Telehealth: Payer: Self-pay | Admitting: Obstetrics and Gynecology

## 2019-08-03 NOTE — Telephone Encounter (Signed)
Patient has a PUS appointment 08/04/19 and needs to reschedule due to starting her cycle.

## 2019-08-03 NOTE — Telephone Encounter (Signed)
Spoke to pt. Pt states starting cycle today and wanting to r/s PUS. PUS rescheduled for 08/11/19 at 4 pm. Pt agreeable and verbalized understanding.   Routing to Dr Talbert Nan for review and will close encounter.

## 2019-08-04 ENCOUNTER — Other Ambulatory Visit: Payer: Self-pay | Admitting: Obstetrics and Gynecology

## 2019-08-04 ENCOUNTER — Other Ambulatory Visit: Payer: Self-pay

## 2019-08-11 ENCOUNTER — Encounter: Payer: Self-pay | Admitting: Obstetrics and Gynecology

## 2019-08-11 ENCOUNTER — Ambulatory Visit (INDEPENDENT_AMBULATORY_CARE_PROVIDER_SITE_OTHER): Payer: 59 | Admitting: Obstetrics and Gynecology

## 2019-08-11 ENCOUNTER — Ambulatory Visit (INDEPENDENT_AMBULATORY_CARE_PROVIDER_SITE_OTHER): Payer: 59

## 2019-08-11 ENCOUNTER — Other Ambulatory Visit: Payer: Self-pay

## 2019-08-11 VITALS — BP 118/70 | HR 80 | Temp 97.9°F | Resp 10 | Ht 65.0 in | Wt 170.0 lb

## 2019-08-11 DIAGNOSIS — Z86018 Personal history of other benign neoplasm: Secondary | ICD-10-CM | POA: Diagnosis not present

## 2019-08-11 DIAGNOSIS — N852 Hypertrophy of uterus: Secondary | ICD-10-CM | POA: Diagnosis not present

## 2019-08-11 DIAGNOSIS — D259 Leiomyoma of uterus, unspecified: Secondary | ICD-10-CM | POA: Diagnosis not present

## 2019-08-11 NOTE — Progress Notes (Signed)
GYNECOLOGY  VISIT   HPI: 44 y.o.   Single Black or African American Not Hispanic or Latino  female   [phone] with Patient's last menstrual period was 08/03/2019.   here for ultrasound  She c/o frequent urination at times. Occasionally aware of the mass in her lower abdomen, uncomfortable at times.  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   Cycles are monthly x 6-7 days, heavy with some cramping.   Recent CBC not anemic, low iron.   GYNECOLOGIC HISTORY: Patient's last menstrual period was 08/03/2019. Contraception:Condoms Menopausal hormone therapy: none        OB History    Gravida  3   Para  2   Term  2   Preterm      AB  1   Living  2     SAB  1   TAB      Ectopic      Multiple      Live Births  2              Patient Active Problem List   Diagnosis Date Noted  . Iron deficiency anemia due to chronic blood loss 10/27/2018  . Menometrorrhagia 10/27/2018  . Enlarged uterus 06/30/2014  . Submucous leiomyoma of uterus 06/30/2014  . Intramural leiomyoma of uterus 06/30/2014    Past Medical History:  Diagnosis Date  . Abnormal Pap smear of cervix 12/11   LGSIL  . Hyperlipidemia   . Menometrorrhagia 10/27/2018  . Vaginal delivery 1998, 2003    Past Surgical History:  Procedure Laterality Date  . COLPOSCOPY  1/12   mild dysplasia CIN1, HPV effect  . DILATION AND CURETTAGE OF UTERUS      Current Outpatient Medications  Medication Sig Dispense Refill  . acetaminophen (TYLENOL) 500 MG tablet Take 500 mg by mouth as needed.    . Ferrous Sulfate (IRON PO) Take 10 mg by mouth daily.    . Multiple Vitamins-Minerals (MULTIVITAMIN PO) Take by mouth daily.     No current facility-administered medications for this visit.     ALLERGIES: Patient has no known allergies.  Family History  Problem Relation Age of Onset  . Thyroid disease Mother   . Hypertension Mother   . Diabetes Mother   . Heart attack Mother   . Sickle cell anemia Sister    . Deep vein thrombosis Sister     Social History   Socioeconomic History  . Marital status: Single    Spouse name: Not on file  . Number of children: Not on file  . Years of education: Not on file  . Highest education level: Not on file  Occupational History  . Not on file  Tobacco Use  . Smoking status: Never Smoker  . Smokeless tobacco: Never Used  Substance and Sexual Activity  . Alcohol use: No  . Drug use: No  . Sexual activity: Yes    Partners: Male    Birth control/protection: Condom  Other Topics Concern  . Not on file  Social History Narrative  . Not on file   Social Determinants of Health   Financial Resource Strain:   . Difficulty of Paying Living Expenses:   Food Insecurity:   . Worried About Charity fundraiser in the Last Year:   . Arboriculturist in the Last Year:   Transportation Needs:   . Film/video editor (Medical):   Marland Kitchen Lack of Transportation (Non-Medical):   Physical Activity:   .  Days of Exercise per Week:   . Minutes of Exercise per Session:   Stress:   . Feeling of Stress :   Social Connections:   . Frequency of Communication with Friends and Family:   . Frequency of Social Gatherings with Friends and Family:   . Attends Religious Services:   . Active Member of Clubs or Organizations:   . Attends Archivist Meetings:   Marland Kitchen Marital Status:   Intimate Partner Violence:   . Fear of Current or Ex-Partner:   . Emotionally Abused:   Marland Kitchen Physically Abused:   . Sexually Abused:     Review of Systems  All other systems reviewed and are negative.   PHYSICAL EXAMINATION:    BP 118/70 (BP Location: Left Arm, Patient Position: Sitting, Cuff Size: Normal)   Pulse 80   Temp 97.9 F (36.6 C) (Temporal)   Resp 10   Ht 5\' 5"  (1.651 m)   Wt 170 lb (77.1 kg)   LMP 08/03/2019   BMI 28.29 kg/m     General appearance: alert, cooperative and appears stated age Abdomen: soft, non-tender; uterus goes from her lower abdomen to 2 cm  above her umbilicus, mobile, not tender.  Pelvic: External genitalia:  no lesions              Cervix: no cervical motion tenderness              Bimanual Exam:  Uterus:  enlarged, mobile, out of her pelvis completely.               Adnexa: no mass, fullness, tenderness              Chaperone was present for exam.  Ultrasound images were reviewed with the patient (see report). Her uterus now measures 16.78 x 13.2 x 9.83 cm compared to 10/02/18 where her uterus measured 14.98 x 11.59 x 8.32 cm.   ASSESSMENT Enlarging fibroid uterus, symptomatic. Discussed option of uterine artery embolization and TLH. She is interested in TLH/BS    PLAN Discussed surgery, risk factors, and recovery Will plan on lupron and then surgery 3 months later.  She will need 6 weeks off post op (heavy lifting at work)   In addition to reviewing the ultrasound results, ~15 minutes was spent in total patient care, including discussing management options

## 2019-08-12 ENCOUNTER — Telehealth: Payer: Self-pay | Admitting: *Deleted

## 2019-08-12 NOTE — Telephone Encounter (Signed)
Referral/order completed and signed by Dr. Talbert Nan  for Lupron 11.25mg  IM q3 month, one refill. Faxed to [facility]

## 2019-08-19 NOTE — Telephone Encounter (Signed)
Janice Thomas with [facility] calling because prescription for Lupron was cut off at bottom when faxed.

## 2019-08-19 NOTE — Telephone Encounter (Signed)
Benefits review received from [employer], Depo-lupron 11.25mg  not covered under pharmacy benefits.   Routing to Dr. Talbert Nan to advise on alternative.

## 2019-08-21 MED ORDER — ORILISSA 200 MG PO TABS: 200.0000 mg | ORAL_TABLET | Freq: Two times a day (BID) | ORAL | 2 refills | Status: DC

## 2019-08-21 NOTE — Telephone Encounter (Signed)
Call placed to patient, provided update on Lupron, reviewed alternative as seen below per Dr. Talbert Nan, patient agreeable to plan. Rx for Freida Busman to verified pharmacy. Advised patient Rx may require PA, pharmacy will notify our office if this is required, can then submit to plan. Advised patient PA does not guarantee coverage, if not covered can review alternatives. Patient verbalizes understanding and is agreeable.   Call placed to [facility], spoke with Timmothy Sours, pharmacist. Rx Lurpon 11.25 mg IM cancelled.

## 2019-08-21 NOTE — Telephone Encounter (Signed)
PA required for Orilissa.  PA submitted to plan via covermymeds.com Key: [id number] - PA Case ID: [id number] - Rx #[id number]  Call to patient, provided update. Also reviewed option of manufacture savings card. Advised patient our office will notify her of response once received from plan. Patient verbalizes understanding and is agreeable.

## 2019-08-21 NOTE — Telephone Encounter (Signed)
Can you please put in a request for orilissa 200 mg BID x 3 months

## 2019-08-24 MED ORDER — ORIAHNN 300-1-0.5 & 300 MG PO CPPK: 300.0000 | ORAL_CAPSULE | Freq: Two times a day (BID) | ORAL | 2 refills | Status: DC

## 2019-08-24 MED ORDER — ORIAHNN 300-1-0.5 & 300 MG PO CPPK: 1.0000 | ORAL_CAPSULE | Freq: Two times a day (BID) | ORAL | 2 refills | Status: DC

## 2019-08-24 NOTE — Telephone Encounter (Signed)
Denied on April 11 Request Reference Number: [id number]. ORILISSA TAB 200MG  is denied for not meeting the prior authorization requirement(s). Details of this decision are in the notice attached below or have been faxed to you. Appeals are not supported through [place]. Please refer to the fax case notice for appeals information and instructions.  Reviewed with Dr. Talbert Nan. Alternative recommended, Oriahnn  capsule, take one tablet PO Qam and Qpm. #60/2RF.   Call to patient, advised as seen above. Patient verbalizes understanding and is agreeable.   [facility] placed to [facility], spoke with Ebony Hail. Rx for [place] cancelled. Was advised PA required for [facility].   PA submitted via covermymeds.com : KeyPaschal Dopp - PA Case [id number] - Rx #: [id number]

## 2019-08-26 NOTE — Telephone Encounter (Signed)
PA denied for [facility].  Urgent Appeal request completed and faxed to [facility].   Completed Oriahnn referral form completed and faxed to [facility].

## 2019-08-27 ENCOUNTER — Telehealth: Payer: Self-pay

## 2019-08-27 NOTE — Telephone Encounter (Signed)
[employer] appeals left message on answering machine with a different fax number because fax was not received. Fax number [phone]

## 2019-08-27 NOTE — Telephone Encounter (Signed)
See telephone encounter dated 08/19/19.   Encounter closed.

## 2019-08-27 NOTE — Telephone Encounter (Signed)
Appeal faxed to [phone]

## 2019-08-27 NOTE — Telephone Encounter (Signed)
Call placed to [facility] provider services. Spoke with [facility] customer service reps Sam and Caryl Asp, was also transferred to [facility]. Confirmed fax numbers provided on denial letter, was advised no alternative fax number to send RX appeal. Continue to attempt to fax to number provided.    Call placed to patient, provided update. Patient is aware Oriahnn Complete referral has been submitted for assistance with medication. [facility] once they have communicated with [facility]. Questions answered.   Call returned to [place], spoke with Danae Chen. Confirmed Oriahnn referral form received. Assistance requested by provider on patient behalf.  Barbette Merino will contact the patients retail pharmacy and the pharmacy will then notify the patient when RX is ready.

## 2019-08-27 NOTE — Telephone Encounter (Signed)
Seth Bake from [place] [facility] called regarding patient enrollment forms. [facility] is needing the following information: Needing to know if it was a doctor request or patient request to be in trial. Phone number [phone] ext [phone]

## 2019-08-27 NOTE — Telephone Encounter (Signed)
Norm Parcel E, CMA 11 minutes ago (9:40 AM)     Seth Bake from [place] [facility] called regarding patient enrollment forms. [facility] is needing the following information: Needing to know if it was a doctor request or patient request to be in trial. Phone number [phone] ext [phone]

## 2019-08-27 NOTE — Telephone Encounter (Signed)
Multiple fax attempts to [facility] on 08/26/19, failed, line busy. Tried alternative number, fax line busy. Tried to fax again on 08/27/19, line busy. Will try to fax again today.

## 2019-08-28 NOTE — Telephone Encounter (Signed)
Received call from [facility] about pt's appeal. Gave same fax number as [phone] for records to be faxed today by noon to get appeal processed.

## 2019-09-01 NOTE — Telephone Encounter (Signed)
Attempted to fax appeal to [facility] at number provided, fax failed.   Call returned to patient. Patient has not picked up Rx for [facility], is concerned about side effects of medication. Discussed option of proceeding with surgery without taking Oriahnn. Patient would like to further discuss with Dr. Talbert Nan before making a decision. MyChart visit scheduled for 09/10/19 at 11:30am with Dr. Talbert Nan. Patient declined earlier appt time.   Routing to provider for final review. Patient is agreeable to disposition. Will close encounter.  Cc: Reesa Chew, RN

## 2019-09-10 ENCOUNTER — Telehealth (INDEPENDENT_AMBULATORY_CARE_PROVIDER_SITE_OTHER): Payer: 59 | Admitting: Obstetrics and Gynecology

## 2019-09-10 ENCOUNTER — Other Ambulatory Visit: Payer: Self-pay

## 2019-09-10 ENCOUNTER — Encounter: Payer: Self-pay | Admitting: Obstetrics and Gynecology

## 2019-09-10 DIAGNOSIS — D259 Leiomyoma of uterus, unspecified: Secondary | ICD-10-CM | POA: Diagnosis not present

## 2019-09-10 NOTE — Progress Notes (Signed)
Virtual Visit via Video Note  I connected with Janice Thomas on 09/10/19 at 11:30 AM EDT by a video enabled telemedicine application and verified that I am speaking with the correct person using two identifiers.  Location: Patient: home Provider: office at [facility].    I discussed the limitations of evaluation and management by telemedicine and the availability of in person appointments. The patient expressed understanding and agreed to proceed.    GYNECOLOGY  VISIT   HPI: 44 y.o.   Single Black or African American Not Hispanic or Latino  female   [phone] with No LMP recorded.   Virtual visit to discuss surgery. The patient has an enlarged fibroid uterus and desires surgery. On recent ultrasound her uterus measured 16.78 x 13.21 x 9.83. On exam her uterus was 2 cm above her umbilicus, but up out of her pelvis on exam.  Insurance declined lupron, Freida Busman was declined, she was able to get a one month sample of [place], but that's it.  LMP 08/29/19  Last pap 07/27/19: negative, negative HPV  GYNECOLOGIC HISTORY: No LMP recorded. Contraception: condoms Menopausal hormone therapy: none        OB History    Gravida  3   Para  2   Term  2   Preterm      AB  1   Living  2     SAB  1   TAB      Ectopic      Multiple      Live Births  2              Patient Active Problem List   Diagnosis Date Noted  . Iron deficiency anemia due to chronic blood loss 10/27/2018  . Menometrorrhagia 10/27/2018  . Enlarged uterus 06/30/2014  . Submucous leiomyoma of uterus 06/30/2014  . Intramural leiomyoma of uterus 06/30/2014    Past Medical History:  Diagnosis Date  . Abnormal Pap smear of cervix 12/11   LGSIL  . Hyperlipidemia   . Menometrorrhagia 10/27/2018  . Vaginal delivery 1998, 2003    Past Surgical History:  Procedure Laterality Date  . COLPOSCOPY  1/12   mild dysplasia CIN1, HPV effect  . DILATION AND CURETTAGE OF UTERUS       Current Outpatient Medications  Medication Sig Dispense Refill  . acetaminophen (TYLENOL) 500 MG tablet Take 500 mg by mouth as needed.    [employer] [place] & Elago (ORIAHNN) 300-1-0.5 & 300 MG CPPK Take 1 capsule by mouth in the morning and at bedtime. 60 each 2  . Ferrous Sulfate (IRON PO) Take 10 mg by mouth daily.    . Multiple Vitamins-Minerals (MULTIVITAMIN PO) Take by mouth daily.     No current facility-administered medications for this visit.     ALLERGIES: Patient has no known allergies.  Family History  Problem Relation Age of Onset  . Thyroid disease Mother   . Hypertension Mother   . Diabetes Mother   . Heart attack Mother   . Sickle cell anemia Sister   . Deep vein thrombosis Sister     Social History   Socioeconomic History  . Marital status: Single    Spouse name: Not on file  . Number of children: Not on file  . Years of education: Not on file  . Highest education level: Not on file  Occupational History  . Not on file  Tobacco Use  . Smoking status: Never Smoker  . Smokeless tobacco: Never Used  Substance and Sexual Activity  . Alcohol use: No  . Drug use: No  . Sexual activity: Yes    Partners: Male    Birth control/protection: Condom  Other Topics Concern  . Not on file  Social History Narrative  . Not on file   Social Determinants of Health   Financial Resource Strain:   . Difficulty of Paying Living Expenses:   Food Insecurity:   . Worried About Charity fundraiser in the Last Year:   . Arboriculturist in the Last Year:   Transportation Needs:   . Film/video editor (Medical):   Marland Kitchen Lack of Transportation (Non-Medical):   Physical Activity:   . Days of Exercise per Week:   . Minutes of Exercise per Session:   Stress:   . Feeling of Stress :   Social Connections:   . Frequency of Communication with Friends and Family:   . Frequency of Social Gatherings with Friends and Family:   . Attends Religious Services:   .  Active Member of Clubs or Organizations:   . Attends Archivist Meetings:   Marland Kitchen Marital Status:   Intimate Partner Violence:   . Fear of Current or Ex-Partner:   . Emotionally Abused:   Marland Kitchen Physically Abused:   . Sexually Abused:     ROS  PHYSICAL EXAMINATION:    There were no vitals taken for this visit.    General appearance: alert, cooperative and appears stated age  ASSESSMENT Symptomatic fibroid uterus, the patient desires definitive treatment Insurance denied lupron and orillisa, only able to get a one month supply of oriahnn. Hoping to decrease the size of her uterus.  Discussed the small risk of HTN, hair loss, elevated LFT's, vasomotor symptoms Discussed that oriahnn isn't contraception.      PLAN Start [place] on the first day of her next cycle and plan surgery one month later.  Plan TLH/BS/cystoscopy She will need to return for a preop   10-15 minutes in total patient care.  Salvadore Dom, MD

## 2019-09-14 ENCOUNTER — Telehealth: Payer: Self-pay | Admitting: Obstetrics and Gynecology

## 2019-09-14 NOTE — Telephone Encounter (Signed)
Spoke with patient regarding surgery benefits. Patient acknowledges understanding of information presented. Patient is aware that benefits presented are for professional benefits only. Patient is aware that once surgery is scheduled, the hospital will call with separate benefits. Patient is aware of surgery cancellation policy. ° °Patient is ready to proceed with scheduling. °

## 2019-09-16 NOTE — Telephone Encounter (Signed)
I think if that's what she can do, we schedule surgery on 10/26/19.

## 2019-09-16 NOTE — Telephone Encounter (Signed)
Spoke with patient. Advised patient Dr.Jertson would like to plan for surgery June 7th to coordinate with the 1 month supply of Barbette Merino she has. Patient states that her daughter is graduating 6/5 and she will be having a cookout that weekend with friends and family. Patient cannot quarantine after COVID test until surgery on 6/7. Advised next date this can be done is 10/26/2019. Advised will need to review this with Dr.Jertson due to timing with medication before proceeding. Patient is agreeable.

## 2019-09-17 NOTE — Telephone Encounter (Signed)
Spoke with patient. Patient would like to proceed with surgery on 10/26/2019. Surgery scheduled for 10/26/2019 at 0900 at [facility]. Pre op scheduled on 10/08/2019 at 2:20 pm with Dr.Jertson. COVID test scheduled for 10/22/2019 at 3 pm at [facility] location. Patient is aware of the need to quarantine after test until surgery. 1 week post op scheduled for 11/03/2019 at 1:30 pm with Dr.Jertson. 4 week post op scheduled for 11/24/2019 at 1 pm with Dr.Jertson. Patient is agreeable to all dates and times. Surgery instructions reviewed. Patient verbalizes understanding. Surgery instruction sheet to [facility] to give patient at her pre op appointment.  Routing to provider and will close encounter.

## 2019-10-08 ENCOUNTER — Ambulatory Visit (INDEPENDENT_AMBULATORY_CARE_PROVIDER_SITE_OTHER): Payer: 59 | Admitting: Obstetrics and Gynecology

## 2019-10-08 ENCOUNTER — Other Ambulatory Visit: Payer: Self-pay

## 2019-10-08 ENCOUNTER — Encounter: Payer: Self-pay | Admitting: Obstetrics and Gynecology

## 2019-10-08 VITALS — BP 144/62 | HR 102 | Temp 98.2°F | Ht 65.0 in | Wt 168.0 lb

## 2019-10-08 DIAGNOSIS — N920 Excessive and frequent menstruation with regular cycle: Secondary | ICD-10-CM

## 2019-10-08 DIAGNOSIS — D259 Leiomyoma of uterus, unspecified: Secondary | ICD-10-CM

## 2019-10-08 DIAGNOSIS — Z862 Personal history of diseases of the blood and blood-forming organs and certain disorders involving the immune mechanism: Secondary | ICD-10-CM

## 2019-10-08 NOTE — Progress Notes (Signed)
GYNECOLOGY  VISIT   HPI: 44 y.o.   Single Black or African American Not Hispanic or Latino  female   [phone] with Patient's last menstrual period was 09/28/2019.   here for pre opt visit. She has a symptomatic fibroid uterus. She has monthly cycles x 6-7 days, heavy and has cramping. H/O anemia, but on iron she isn't anemic. She has had an increase in the size of her uterus in the past year and the bulk symptoms are bothering her.  On 07/27/19 her uterus measured 16.78 x 13.2 x 9.83 cm compared to 10/02/18 where her uterus measured 14.98 x 11.59 x 8.32 cm.   Her insurance denied coverage of lupron, she was only able to get a one month supply of oriahnn (which she is on).  Negative endometrial biopsy in 5/20  Negative pap with negative HPR in 3/21  Sexually active, uses condoms.   GYNECOLOGIC HISTORY: Patient's last menstrual period was 09/28/2019. Contraception: condoms Menopausal hormone therapy: N/a        OB History    Gravida  3   Para  2   Term  2   Preterm      AB  1   Living  2     SAB  1   TAB      Ectopic      Multiple      Live Births  2            Largest baby was just over 7 lbs.   Patient Active Problem List   Diagnosis Date Noted  . Iron deficiency anemia due to chronic blood loss 10/27/2018  . Menometrorrhagia 10/27/2018  . Enlarged uterus 06/30/2014  . Submucous leiomyoma of uterus 06/30/2014  . Intramural leiomyoma of uterus 06/30/2014    Past Medical History:  Diagnosis Date  . Abnormal Pap smear of cervix 12/11   LGSIL  . Hyperlipidemia   . Menometrorrhagia 10/27/2018  . Vaginal delivery 1998, 2003    Past Surgical History:  Procedure Laterality Date  . COLPOSCOPY  1/12   mild dysplasia CIN1, HPV effect  . DILATION AND CURETTAGE OF UTERUS      Current Outpatient Medications  Medication Sig Dispense Refill  . Elagolix-Estrad-Noreth & Elago (ORIAHNN) 300-1-0.5 & 300 MG CPPK Take 1 capsule by mouth in the morning and at  bedtime. 60 each 2  . Ferrous Sulfate (IRON PO) Take 1 tablet by mouth daily.     . Multiple Vitamins-Minerals (MULTIVITAMIN PO) Take 1 tablet by mouth daily.      No current facility-administered medications for this visit.     ALLERGIES: Patient has no known allergies.  Family History  Problem Relation Age of Onset  . Thyroid disease Mother   . Hypertension Mother   . Diabetes Mother   . Heart attack Mother   . Sickle cell anemia Sister   . Deep vein thrombosis Sister   Dad diabetic. Both parents are deceased. Dad had DVT.  Social History   Socioeconomic History  . Marital status: Single    Spouse name: Not on file  . Number of children: Not on file  . Years of education: Not on file  . Highest education level: Not on file  Occupational History  . Not on file  Tobacco Use  . Smoking status: Never Smoker  . Smokeless tobacco: Never Used  Substance and Sexual Activity  . Alcohol use: No  . Drug use: No  . Sexual activity: Yes  Partners: Male    Birth control/protection: Condom  Other Topics Concern  . Not on file  Social History Narrative  . Not on file   Social Determinants of Health   Financial Resource Strain:   . Difficulty of Paying Living Expenses:   Food Insecurity:   . Worried About Charity fundraiser in the Last Year:   . Arboriculturist in the Last Year:   Transportation Needs:   . Film/video editor (Medical):   Marland Kitchen Lack of Transportation (Non-Medical):   Physical Activity:   . Days of Exercise per Week:   . Minutes of Exercise per Session:   Stress:   . Feeling of Stress :   Social Connections:   . Frequency of Communication with Friends and Family:   . Frequency of Social Gatherings with Friends and Family:   . Attends Religious Services:   . Active Member of Clubs or Organizations:   . Attends Archivist Meetings:   Marland Kitchen Marital Status:   Intimate Partner Violence:   . Fear of Current or Ex-Partner:   . Emotionally Abused:    Marland Kitchen Physically Abused:   . Sexually Abused:     Review of Systems  All other systems reviewed and are negative.   PHYSICAL EXAMINATION:    BP (!) 144/62   Pulse (!) 102   Temp 98.2 F (36.8 C)   Ht 5\' 5"  (1.651 m)   Wt 168 lb (76.2 kg)   LMP 09/28/2019   SpO2 99%   BMI 27.96 kg/m    (nervous, BP typically normal) General appearance: alert, cooperative and appears stated age Neck: no adenopathy, supple, symmetrical, trachea midline and thyroid normal to inspection and palpation Heart: regular rate and rhythm Lungs: CTAB Abdomen: soft, non-tender; uterus is enlarged, a few cm above her umbilicus, [id number] week sized Extremities: normal, atraumatic, no cyanosis Skin: normal color, texture and turgor, no rashes or lesions Lymph: normal cervical supraclavicular and inguinal nodes Neurologic: grossly normal Pelvic: deferred, on recent exam her uterus was completely out of her pelvis.    ASSESSMENT Symptomatic, enlarging fibroid uterus. Not anemic (on iron), normal pap, negative biopsy in 5/20)    PLAN Discussed total laparoscopic hysterectomy, bilateral salpingectomy and cystoscopy. Discussed placing her uterus in a bag and morcellating it within the bag.  Reviewed the risks of the procedure, including infection, bleeding, damage to bowel/badder/vessels/ureters.  Discussed the possible need for laparotomy. Discussed post operative recovery and risk of cuff dehiscence. All of her questions were answered

## 2019-10-08 NOTE — H&P (View-Only) (Signed)
GYNECOLOGY  VISIT   HPI: 44 y.o.   Single Black or African American Not Hispanic or Latino  female   [phone] with Patient's last menstrual period was 09/28/2019.   here for pre opt visit. She has a symptomatic fibroid uterus. She has monthly cycles x 6-7 days, heavy and has cramping. H/O anemia, but on iron she isn't anemic. She has had an increase in the size of her uterus in the past year and the bulk symptoms are bothering her.  On 07/27/19 her uterus measured 16.78 x 13.2 x 9.83 cm compared to 10/02/18 where her uterus measured 14.98 x 11.59 x 8.32 cm.   Her insurance denied coverage of lupron, she was only able to get a one month supply of oriahnn (which she is on).  Negative endometrial biopsy in 5/20  Negative pap with negative HPR in 3/21  Sexually active, uses condoms.   GYNECOLOGIC HISTORY: Patient's last menstrual period was 09/28/2019. Contraception: condoms Menopausal hormone therapy: N/a        OB History    Gravida  3   Para  2   Term  2   Preterm      AB  1   Living  2     SAB  1   TAB      Ectopic      Multiple      Live Births  2            Largest baby was just over 7 lbs.   Patient Active Problem List   Diagnosis Date Noted  . Iron deficiency anemia due to chronic blood loss 10/27/2018  . Menometrorrhagia 10/27/2018  . Enlarged uterus 06/30/2014  . Submucous leiomyoma of uterus 06/30/2014  . Intramural leiomyoma of uterus 06/30/2014    Past Medical History:  Diagnosis Date  . Abnormal Pap smear of cervix 12/11   LGSIL  . Hyperlipidemia   . Menometrorrhagia 10/27/2018  . Vaginal delivery 1998, 2003    Past Surgical History:  Procedure Laterality Date  . COLPOSCOPY  1/12   mild dysplasia CIN1, HPV effect  . DILATION AND CURETTAGE OF UTERUS      Current Outpatient Medications  Medication Sig Dispense Refill  . Elagolix-Estrad-Noreth & Elago (ORIAHNN) 300-1-0.5 & 300 MG CPPK Take 1 capsule by mouth in the morning and at  bedtime. 60 each 2  . Ferrous Sulfate (IRON PO) Take 1 tablet by mouth daily.     . Multiple Vitamins-Minerals (MULTIVITAMIN PO) Take 1 tablet by mouth daily.      No current facility-administered medications for this visit.     ALLERGIES: Patient has no known allergies.  Family History  Problem Relation Age of Onset  . Thyroid disease Mother   . Hypertension Mother   . Diabetes Mother   . Heart attack Mother   . Sickle cell anemia Sister   . Deep vein thrombosis Sister   Dad diabetic. Both parents are deceased. Dad had DVT.  Social History   Socioeconomic History  . Marital status: Single    Spouse name: Not on file  . Number of children: Not on file  . Years of education: Not on file  . Highest education level: Not on file  Occupational History  . Not on file  Tobacco Use  . Smoking status: Never Smoker  . Smokeless tobacco: Never Used  Substance and Sexual Activity  . Alcohol use: No  . Drug use: No  . Sexual activity: Yes  Partners: Male    Birth control/protection: Condom  Other Topics Concern  . Not on file  Social History Narrative  . Not on file   Social Determinants of Health   Financial Resource Strain:   . Difficulty of Paying Living Expenses:   Food Insecurity:   . Worried About Charity fundraiser in the Last Year:   . Arboriculturist in the Last Year:   Transportation Needs:   . Film/video editor (Medical):   Marland Kitchen Lack of Transportation (Non-Medical):   Physical Activity:   . Days of Exercise per Week:   . Minutes of Exercise per Session:   Stress:   . Feeling of Stress :   Social Connections:   . Frequency of Communication with Friends and Family:   . Frequency of Social Gatherings with Friends and Family:   . Attends Religious Services:   . Active Member of Clubs or Organizations:   . Attends Archivist Meetings:   Marland Kitchen Marital Status:   Intimate Partner Violence:   . Fear of Current or Ex-Partner:   . Emotionally Abused:    Marland Kitchen Physically Abused:   . Sexually Abused:     Review of Systems  All other systems reviewed and are negative.   PHYSICAL EXAMINATION:    BP (!) 144/62   Pulse (!) 102   Temp 98.2 F (36.8 C)   Ht 5\' 5"  (1.651 m)   Wt 168 lb (76.2 kg)   LMP 09/28/2019   SpO2 99%   BMI 27.96 kg/m    (nervous, BP typically normal) General appearance: alert, cooperative and appears stated age Neck: no adenopathy, supple, symmetrical, trachea midline and thyroid normal to inspection and palpation Heart: regular rate and rhythm Lungs: CTAB Abdomen: soft, non-tender; uterus is enlarged, a few cm above her umbilicus, [id number] week sized Extremities: normal, atraumatic, no cyanosis Skin: normal color, texture and turgor, no rashes or lesions Lymph: normal cervical supraclavicular and inguinal nodes Neurologic: grossly normal Pelvic: deferred, on recent exam her uterus was completely out of her pelvis.    ASSESSMENT Symptomatic, enlarging fibroid uterus. Not anemic (on iron), normal pap, negative biopsy in 5/20)    PLAN Discussed total laparoscopic hysterectomy, bilateral salpingectomy and cystoscopy. Discussed placing her uterus in a bag and morcellating it within the bag.  Reviewed the risks of the procedure, including infection, bleeding, damage to bowel/badder/vessels/ureters.  Discussed the possible need for laparotomy. Discussed post operative recovery and risk of cuff dehiscence. All of her questions were answered

## 2019-10-14 NOTE — Patient Instructions (Addendum)
DUE TO COVID-19 ONLY ONE VISITOR IS ALLOWED IN WAITING ROOM (VISITOR WILL HAVE A TEMPERATURE CHECK ON ARRIVAL AND MUST WEAR A FACE MASK THE ENTIRE TIME.)  ONCE YOU ARE ADMITTED TO YOUR PRIVATE ROOM, THE SAME ONE VISITOR IS ALLOWED TO VISIT DURING VISITING HOURS ONLY.  Your COVID swab testing is scheduled for: 10/22/19  at 3:00 pm , You must self quarantine after your testing per handout given to you at the testing site.  ([facility] testing enter pre-surgical testing line)    Your procedure is scheduled on: 10/26/19  Report to [facility] AT: 7:15  A. M.   Call this number if you have problems the morning of surgery:[phone].   OUR ADDRESS IS [address]  WE ARE LOCATED IN THE [place]                                   [facility].                                     REMEMBER:   DO NOT EAT SOLID FOOD  AFTER MIDNIGHT. CLEAR LIQUID DIET FROM MIDNIGHT UNTIL: 6:00 AM.      CLEAR LIQUID DIET   Foods Allowed                                                                     Foods Excluded  Coffee and tea, regular and decaf                             liquids that you cannot  Plain Jell-O any favor except red or purple                                           see through such as: Fruit ices (not with fruit pulp)                                     milk, soups, orange juice  Iced Popsicles                                    All solid food Carbonated beverages, regular and diet                                    Cranberry, grape and apple juices Sports drinks like Gatorade Lightly seasoned clear broth or consume(fat free) Sugar, honey syrup  Sample Menu Breakfast                                Lunch  Supper Cranberry juice                    Beef broth                            Chicken broth Jell-O                                     Grape juice                            Apple juice Coffee or tea                        Jell-O                                      Popsicle                                                Coffee or tea                        Coffee or tea  _____________________________________________________________________  BRUSH YOUR TEETH THE MORNING OF SURGERY.  DO NOT WEAR JEWERLY, MAKE UP, OR NAIL POLISH.  DO NOT WEAR LOTIONS, POWDERS, PERFUMES/COLOGNE OR DEODORANT.  DO NOT SHAVE FOR 24 HOURS PRIOR TO DAY OF SURGERY.  CONTACTS, GLASSES, OR DENTURES MAY NOT BE WORN TO SURGERY.                                    [facility] IS NOT RESPONSIBLE  FOR ANY BELONGINGS.         BRING ALL PRESCRIPTION MEDICATIONS WITH YOU THE DAY OF SURGERY IN ORIGINAL CONTAINERS                              YOU MAY BRING A SMALL OVERNIGHT BAG                                    .[facility] - Preparing for Surgery Before surgery, you can play an important role.  Because skin is not sterile, your skin needs to be as free of germs as possible.  You can reduce the number of germs on your skin by washing with CHG (chlorahexidine gluconate) soap before surgery.  CHG is an antiseptic cleaner which kills germs and bonds with the skin to continue killing germs even after washing. Please DO NOT use if you have an allergy to CHG or antibacterial soaps.  If your skin becomes reddened/irritated stop using the CHG and inform your nurse when you arrive at Short Stay. Do not shave (including legs and underarms) for at least 48 hours prior to the first CHG shower.  You may shave your face/neck. Please follow these instructions carefully:  1.  Shower with CHG Soap the night before surgery and the  morning  of Surgery.  2.  If you choose to wash your hair, wash your hair first as usual with your  normal  shampoo.  3.  After you shampoo, rinse your hair and body thoroughly to remove the  shampoo.                           4.  Use CHG as you would any other liquid soap.  You  can apply chg directly  to the skin and wash                       Gently with a scrungie or clean washcloth.  5.  Apply the CHG Soap to your body ONLY FROM THE NECK DOWN.   Do not use on face/ open                           Wound or open sores. Avoid contact with eyes, ears mouth and genitals (private parts).                       Wash face,  Genitals (private parts) with your normal soap.             6.  Wash thoroughly, paying special attention to the area where your surgery  will be performed.  7.  Thoroughly rinse your body with warm water from the neck down.  8.  DO NOT shower/wash with your normal soap after using and rinsing off  the CHG Soap.                9.  Pat yourself dry with a clean towel.            10.  Wear clean pajamas.            11.  Place clean sheets on your bed the night of your first shower and do not  sleep with pets. Day of Surgery : Do not apply any lotions/deodorants the morning of surgery.  Please wear clean clothes to the hospital/surgery center.  FAILURE TO FOLLOW THESE INSTRUCTIONS MAY RESULT IN THE CANCELLATION OF YOUR SURGERY PATIENT SIGNATURE_________________________________  NURSE SIGNATURE__________________________________  ________________________________________________________________________   Janice Thomas  An incentive spirometer is a tool that can help keep your lungs clear and active. This tool measures how well you are filling your lungs with each breath. Taking long deep breaths may help reverse or decrease the chance of developing breathing (pulmonary) problems (especially infection) following:  A long period of time when you are unable to move or be active. BEFORE THE PROCEDURE   If the spirometer includes an indicator to show your best effort, your nurse or respiratory therapist will set it to a desired goal.  If possible, sit up straight or lean slightly forward. Try not to slouch.  Hold the incentive spirometer in an  upright position. INSTRUCTIONS FOR USE  1. Sit on the edge of your bed if possible, or sit up as far as you can in bed or on a chair. 2. Hold the incentive spirometer in an upright position. 3. Breathe out normally. 4. Place the mouthpiece in your mouth and seal your lips tightly around it. 5. Breathe in slowly and as deeply as possible, raising the piston or the ball toward the top of the column. 6. Hold your breath for 3-5 seconds or for as long as possible.  Allow the piston or ball to fall to the bottom of the column. 7. Remove the mouthpiece from your mouth and breathe out normally. 8. Rest for a few seconds and repeat Steps 1 through 7 at least 10 times every 1-2 hours when you are awake. Take your time and take a few normal breaths between deep breaths. 9. The spirometer may include an indicator to show your best effort. Use the indicator as a goal to work toward during each repetition. 10. After each set of 10 deep breaths, practice coughing to be sure your lungs are clear. If you have an incision (the cut made at the time of surgery), support your incision when coughing by placing a pillow or rolled up towels firmly against it. Once you are able to get out of bed, walk around indoors and cough well. You may stop using the incentive spirometer when instructed by your caregiver.  RISKS AND COMPLICATIONS  Take your time so you do not get dizzy or light-headed.  If you are in pain, you may need to take or ask for pain medication before doing incentive spirometry. It is harder to take a deep breath if you are having pain. AFTER USE  Rest and breathe slowly and easily.  It can be helpful to keep track of a log of your progress. Your caregiver can provide you with a simple table to help with this. If you are using the spirometer at home, follow these instructions: [place] IF:   You are having difficultly using the spirometer.  You have trouble using the spirometer as often as  instructed.  Your pain medication is not giving enough relief while using the spirometer.  You develop fever of 100.5 F (38.1 C) or higher. SEEK IMMEDIATE MEDICAL CARE IF:   You cough up bloody sputum that had not been present before.  You develop fever of 102 F (38.9 C) or greater.  You develop worsening pain at or near the incision site. MAKE SURE YOU:   Understand these instructions.  Will watch your condition.  Will get help right away if you are not doing well or get worse. Document Released: 09/10/2006 Document Revised: 07/23/2011 Document Reviewed: 11/11/2006 [facility] Patient Information 2014 [place], [place].   ________________________________________________________________________

## 2019-10-15 ENCOUNTER — Encounter (HOSPITAL_COMMUNITY): Payer: Self-pay

## 2019-10-15 ENCOUNTER — Encounter (HOSPITAL_COMMUNITY)
Admission: RE | Admit: 2019-10-15 | Discharge: 2019-10-15 | Disposition: A | Discharge status: Home / Self Care | Payer: 59 | Source: Ambulatory Visit | Attending: Obstetrics and Gynecology | Admitting: Obstetrics and Gynecology

## 2019-10-15 ENCOUNTER — Other Ambulatory Visit: Payer: Self-pay

## 2019-10-15 DIAGNOSIS — Z01812 Encounter for preprocedural laboratory examination: Secondary | ICD-10-CM | POA: Insufficient documentation

## 2019-10-15 HISTORY — DX: Anemia, unspecified: D64.9

## 2019-10-15 LAB — CBC
HCT: 34.9 % — ABNORMAL LOW (ref 36.0–46.0)
Hemoglobin: 10.8 g/dL — ABNORMAL LOW (ref 12.0–15.0)
MCH: 26.6 pg (ref 26.0–34.0)
MCHC: 30.9 g/dL (ref 30.0–36.0)
MCV: 86 fL (ref 80.0–100.0)
Platelets: 342 10*3/uL (ref 150–400)
RBC: 4.06 MIL/uL (ref 3.87–5.11)
RDW: 13.2 % (ref 11.5–15.5)
WBC: 4.6 10*3/uL (ref 4.0–10.5)
nRBC: 0 % (ref 0.0–0.2)

## 2019-10-15 LAB — COMPREHENSIVE METABOLIC PANEL
ALT: 12 U/L (ref 0–44)
AST: 17 U/L (ref 15–41)
Albumin: 4.1 g/dL (ref 3.5–5.0)
Alkaline Phosphatase: 32 U/L — ABNORMAL LOW (ref 38–126)
Anion gap: 6 (ref 5–15)
BUN: 13 mg/dL (ref 6–20)
CO2: 27 mmol/L (ref 22–32)
Calcium: 9 mg/dL (ref 8.9–10.3)
Chloride: 105 mmol/L (ref 98–111)
Creatinine, Ser: 0.83 mg/dL (ref 0.44–1.00)
GFR calc Af Amer: >60 mL/min (ref >60)
GFR calc non Af Amer: >60 mL/min (ref >60)
Glucose, Bld: 92 mg/dL (ref 70–99)
Potassium: 4.4 mmol/L (ref 3.5–5.1)
Sodium: 138 mmol/L (ref 135–145)
Total Bilirubin: 0.7 mg/dL (ref 0.3–1.2)
Total Protein: 7.5 g/dL (ref 6.5–8.1)

## 2019-10-15 LAB — TYPE AND SCREEN
ABO/RH(D): O NEG
Antibody Screen: NEGATIVE

## 2019-10-15 LAB — ABO/RH: ABO/RH(D): O NEG

## 2019-10-15 NOTE — Progress Notes (Signed)
COVID Vaccine Completed: No Date COVID Vaccine completed: COVID vaccine manufacturer: [place]   PCP - Micah Flesher: PA Cardiologist -   Chest x-ray -  EKG -  Stress Test -  ECHO -  Cardiac Cath -   Sleep Study -  CPAP -   Fasting Blood Sugar -  Checks Blood Sugar _____ times a day  Blood Thinner Instructions: Aspirin Instructions: Last Dose:  Anesthesia review:   Patient denies shortness of breath, fever, cough and chest pain at PAT appointment   Patient verbalized understanding of instructions that were given to them at the PAT appointment. Patient was also instructed that they will need to review over the PAT instructions again at home before surgery.

## 2019-10-22 ENCOUNTER — Other Ambulatory Visit (HOSPITAL_COMMUNITY)
Admission: RE | Admit: 2019-10-22 | Discharge: 2019-10-22 | Disposition: A | Discharge status: Home / Self Care | Payer: 59 | Source: Ambulatory Visit | Attending: Obstetrics and Gynecology | Admitting: Obstetrics and Gynecology

## 2019-10-22 DIAGNOSIS — Z01812 Encounter for preprocedural laboratory examination: Secondary | ICD-10-CM | POA: Insufficient documentation

## 2019-10-22 DIAGNOSIS — Z20822 Contact with and (suspected) exposure to covid-19: Secondary | ICD-10-CM | POA: Insufficient documentation

## 2019-10-22 LAB — SARS CORONAVIRUS 2 (TAT 6-24 HRS): SARS Coronavirus 2: NEGATIVE

## 2019-10-25 NOTE — Anesthesia Preprocedure Evaluation (Addendum)
Anesthesia Evaluation  Patient identified by MRN, date of birth, ID band Patient awake    Reviewed: Allergy & Precautions, NPO status , Patient's Chart, lab work & pertinent test results  History of Anesthesia Complications Negative for: history of anesthetic complications  Airway Mallampati: II  TM Distance: >3 FB Neck ROM: Full    Dental  (+) Missing,    Pulmonary neg pulmonary ROS,    Pulmonary exam normal        Cardiovascular negative cardio ROS Normal cardiovascular exam     Neuro/Psych negative neurological ROS  negative psych ROS   GI/Hepatic negative GI ROS, Neg liver ROS,   Endo/Other  negative endocrine ROS  Renal/GU negative Renal ROS  negative genitourinary   Musculoskeletal negative musculoskeletal ROS (+)   Abdominal   Peds  Hematology  (+) anemia , Hgb 10.8   Anesthesia Other Findings Day of surgery medications reviewed with patient.  Reproductive/Obstetrics Uterine leiomyoma, Enlarged uterus                            Anesthesia Physical Anesthesia Plan  ASA: II  Anesthesia Plan: General   Post-op Pain Management:    Induction: Intravenous  PONV Risk Score and Plan: 4 or greater and Treatment may vary due to age or medical condition, Ondansetron, Dexamethasone, Midazolam and Scopolamine patch - Pre-op  Airway Management Planned: Oral ETT  Additional Equipment: None  Intra-op Plan:   Post-operative Plan: Extubation in OR  Informed Consent: I have reviewed the patients History and Physical, chart, labs and discussed the procedure including the risks, benefits and alternatives for the proposed anesthesia with the patient or authorized representative who has indicated his/her understanding and acceptance.     Dental advisory given  Plan Discussed with: CRNA  Anesthesia Plan Comments:        Anesthesia Quick Evaluation

## 2019-10-26 ENCOUNTER — Ambulatory Visit (HOSPITAL_BASED_OUTPATIENT_CLINIC_OR_DEPARTMENT_OTHER)
Admission: RE | Admit: 2019-10-26 | Discharge: 2019-10-26 | Disposition: A | Discharge status: Home / Self Care | Payer: 59 | Attending: Obstetrics and Gynecology | Admitting: Obstetrics and Gynecology

## 2019-10-26 ENCOUNTER — Encounter (HOSPITAL_BASED_OUTPATIENT_CLINIC_OR_DEPARTMENT_OTHER)
Admission: RE | Disposition: A | Discharge status: Home / Self Care | Payer: Self-pay | Source: Home / Self Care | Attending: Obstetrics and Gynecology

## 2019-10-26 ENCOUNTER — Ambulatory Visit (HOSPITAL_BASED_OUTPATIENT_CLINIC_OR_DEPARTMENT_OTHER): Payer: 59 | Admitting: Anesthesiology

## 2019-10-26 ENCOUNTER — Other Ambulatory Visit: Payer: Self-pay

## 2019-10-26 DIAGNOSIS — N852 Hypertrophy of uterus: Secondary | ICD-10-CM | POA: Diagnosis not present

## 2019-10-26 DIAGNOSIS — D259 Leiomyoma of uterus, unspecified: Secondary | ICD-10-CM | POA: Insufficient documentation

## 2019-10-26 DIAGNOSIS — N921 Excessive and frequent menstruation with irregular cycle: Secondary | ICD-10-CM | POA: Diagnosis not present

## 2019-10-26 DIAGNOSIS — Z90710 Acquired absence of both cervix and uterus: Secondary | ICD-10-CM | POA: Diagnosis present

## 2019-10-26 DIAGNOSIS — D500 Iron deficiency anemia secondary to blood loss (chronic): Secondary | ICD-10-CM | POA: Diagnosis not present

## 2019-10-26 HISTORY — PX: TOTAL LAPAROSCOPIC HYSTERECTOMY WITH SALPINGECTOMY: SHX6742

## 2019-10-26 HISTORY — PX: CYSTOSCOPY: SHX5120

## 2019-10-26 LAB — CBC
HCT: 37.3 % (ref 36.0–46.0)
Hemoglobin: 11.6 g/dL — ABNORMAL LOW (ref 12.0–15.0)
MCH: 26.7 pg (ref 26.0–34.0)
MCHC: 31.1 g/dL (ref 30.0–36.0)
MCV: 85.7 fL (ref 80.0–100.0)
Platelets: 231 10*3/uL (ref 150–400)
RBC: 4.35 MIL/uL (ref 3.87–5.11)
RDW: 13.7 % (ref 11.5–15.5)
WBC: 12.4 10*3/uL — ABNORMAL HIGH (ref 4.0–10.5)
nRBC: 0 % (ref 0.0–0.2)

## 2019-10-26 LAB — POCT PREGNANCY, URINE: Preg Test, Ur: NEGATIVE

## 2019-10-26 SURGERY — HYSTERECTOMY, TOTAL, LAPAROSCOPIC, WITH SALPINGECTOMY
Anesthesia: General | Site: Bladder

## 2019-10-26 MED ORDER — DEXAMETHASONE SODIUM PHOSPHATE 10 MG/ML IJ SOLN
INTRAMUSCULAR | Status: DC | PRN
  Administered 2019-10-26: 10 mg via INTRAVENOUS

## 2019-10-26 MED ORDER — EPHEDRINE 5 MG/ML INJ
INTRAVENOUS | Status: AC
  Filled 2019-10-26: qty 10

## 2019-10-26 MED ORDER — OXYCODONE HCL 5 MG/5ML PO SOLN: 5.0000 mg | Freq: Once | ORAL | Status: DC | PRN

## 2019-10-26 MED ORDER — ROCURONIUM BROMIDE 100 MG/10ML IV SOLN
INTRAVENOUS | Status: DC | PRN
  Administered 2019-10-26: 10 mg via INTRAVENOUS
  Administered 2019-10-26: 30 mg via INTRAVENOUS
  Administered 2019-10-26: 50 mg via INTRAVENOUS

## 2019-10-26 MED ORDER — DOCUSATE SODIUM 100 MG PO CAPS: 100.0000 mg | ORAL_CAPSULE | Freq: Two times a day (BID) | ORAL | Status: DC

## 2019-10-26 MED ORDER — OXYCODONE HCL 5 MG PO TABS
ORAL_TABLET | ORAL | Status: AC
  Filled 2019-10-26: qty 1

## 2019-10-26 MED ORDER — FENTANYL CITRATE (PF) 100 MCG/2ML IJ SOLN: 25.0000 ug | INTRAMUSCULAR | Status: DC | PRN

## 2019-10-26 MED ORDER — ENOXAPARIN SODIUM 40 MG/0.4ML ~~LOC~~ SOLN
40.0000 mg | SUBCUTANEOUS | Status: AC
  Administered 2019-10-26: 40 mg via SUBCUTANEOUS

## 2019-10-26 MED ORDER — DEXAMETHASONE SODIUM PHOSPHATE 10 MG/ML IJ SOLN
INTRAMUSCULAR | Status: AC
  Filled 2019-10-26: qty 1

## 2019-10-26 MED ORDER — SODIUM CHLORIDE 0.9 % IV SOLN
2.0000 g | INTRAVENOUS | Status: AC
  Administered 2019-10-26: 2 g via INTRAVENOUS

## 2019-10-26 MED ORDER — ONDANSETRON HCL 4 MG PO TABS: 4.0000 mg | ORAL_TABLET | Freq: Four times a day (QID) | ORAL | Status: DC | PRN

## 2019-10-26 MED ORDER — ENOXAPARIN SODIUM 40 MG/0.4ML ~~LOC~~ SOLN
SUBCUTANEOUS | Status: AC
  Filled 2019-10-26: qty 0.4

## 2019-10-26 MED ORDER — KETOROLAC TROMETHAMINE 30 MG/ML IJ SOLN
INTRAMUSCULAR | Status: AC
  Filled 2019-10-26: qty 1

## 2019-10-26 MED ORDER — PROMETHAZINE HCL 25 MG/ML IJ SOLN
6.2500 mg | INTRAMUSCULAR | Status: DC | PRN
  Administered 2019-10-26: 6.25 mg via INTRAVENOUS

## 2019-10-26 MED ORDER — LIDOCAINE 2% (20 MG/ML) 5 ML SYRINGE
INTRAMUSCULAR | Status: AC
  Filled 2019-10-26: qty 5

## 2019-10-26 MED ORDER — ONDANSETRON HCL 4 MG/2ML IJ SOLN
INTRAMUSCULAR | Status: AC
  Filled 2019-10-26: qty 2

## 2019-10-26 MED ORDER — HYDROMORPHONE HCL 1 MG/ML IJ SOLN
INTRAMUSCULAR | Status: AC
  Filled 2019-10-26: qty 1

## 2019-10-26 MED ORDER — ACETAMINOPHEN 500 MG PO TABS
ORAL_TABLET | ORAL | Status: AC
  Filled 2019-10-26: qty 2

## 2019-10-26 MED ORDER — SODIUM CHLORIDE 0.9 % IV SOLN
INTRAVENOUS | Status: AC
  Filled 2019-10-26: qty 2

## 2019-10-26 MED ORDER — MENTHOL 3 MG MT LOZG: 1.0000 | LOZENGE | OROMUCOSAL | Status: DC | PRN

## 2019-10-26 MED ORDER — SIMETHICONE 80 MG PO CHEW: 80.0000 mg | CHEWABLE_TABLET | Freq: Four times a day (QID) | ORAL | Status: DC | PRN

## 2019-10-26 MED ORDER — ONDANSETRON HCL 4 MG/2ML IJ SOLN
INTRAMUSCULAR | Status: DC | PRN
  Administered 2019-10-26: 4 mg via INTRAVENOUS

## 2019-10-26 MED ORDER — SCOPOLAMINE 1 MG/3DAYS TD PT72
1.0000 | MEDICATED_PATCH | Freq: Once | TRANSDERMAL | Status: DC
  Administered 2019-10-26: 1.5 mg via TRANSDERMAL

## 2019-10-26 MED ORDER — DOCUSATE SODIUM 100 MG PO CAPS: 100.0000 mg | ORAL_CAPSULE | Freq: Two times a day (BID) | ORAL | 0 refills | Status: DC

## 2019-10-26 MED ORDER — ONDANSETRON HCL 4 MG/2ML IJ SOLN
4.0000 mg | Freq: Four times a day (QID) | INTRAMUSCULAR | Status: DC | PRN
  Administered 2019-10-26: 4 mg via INTRAVENOUS

## 2019-10-26 MED ORDER — HYDROMORPHONE HCL 2 MG/ML IJ SOLN
INTRAMUSCULAR | Status: AC
  Filled 2019-10-26: qty 1

## 2019-10-26 MED ORDER — FENTANYL CITRATE (PF) 100 MCG/2ML IJ SOLN
INTRAMUSCULAR | Status: DC | PRN
  Administered 2019-10-26: 50 ug via INTRAVENOUS
  Administered 2019-10-26: 25 ug via INTRAVENOUS
  Administered 2019-10-26 (×2): 50 ug via INTRAVENOUS

## 2019-10-26 MED ORDER — MIDAZOLAM HCL 5 MG/5ML IJ SOLN
INTRAMUSCULAR | Status: DC | PRN
  Administered 2019-10-26: 2 mg via INTRAVENOUS

## 2019-10-26 MED ORDER — FENTANYL CITRATE (PF) 250 MCG/5ML IJ SOLN
INTRAMUSCULAR | Status: AC
  Filled 2019-10-26: qty 5

## 2019-10-26 MED ORDER — SODIUM CHLORIDE 0.9 % IR SOLN
Status: DC | PRN
  Administered 2019-10-26: 200 mL via INTRAVESICAL

## 2019-10-26 MED ORDER — KETOROLAC TROMETHAMINE 30 MG/ML IJ SOLN: 30.0000 mg | Freq: Four times a day (QID) | INTRAMUSCULAR | Status: DC

## 2019-10-26 MED ORDER — HYDROMORPHONE HCL 1 MG/ML IJ SOLN
0.2000 mg | INTRAMUSCULAR | Status: DC | PRN
  Administered 2019-10-26: 0.5 mg via INTRAVENOUS

## 2019-10-26 MED ORDER — ROPIVACAINE HCL 5 MG/ML IJ SOLN
INTRAMUSCULAR | Status: DC | PRN
  Administered 2019-10-26: 30 mL

## 2019-10-26 MED ORDER — EPHEDRINE SULFATE-NACL 50-0.9 MG/10ML-% IV SOSY
PREFILLED_SYRINGE | INTRAVENOUS | Status: DC | PRN
  Administered 2019-10-26: 10 mg via INTRAVENOUS

## 2019-10-26 MED ORDER — LACTATED RINGERS IV SOLN: INTRAVENOUS | Status: DC

## 2019-10-26 MED ORDER — PROMETHAZINE HCL 25 MG/ML IJ SOLN
INTRAMUSCULAR | Status: AC
  Filled 2019-10-26: qty 1

## 2019-10-26 MED ORDER — BUPIVACAINE HCL (PF) 0.25 % IJ SOLN
INTRAMUSCULAR | Status: DC | PRN
  Administered 2019-10-26: 15 mL

## 2019-10-26 MED ORDER — LIDOCAINE HCL (CARDIAC) PF 100 MG/5ML IV SOSY
PREFILLED_SYRINGE | INTRAVENOUS | Status: DC | PRN
  Administered 2019-10-26: 40 mg via INTRAVENOUS

## 2019-10-26 MED ORDER — ENOXAPARIN SODIUM 40 MG/0.4ML ~~LOC~~ SOLN: 40.0000 mg | SUBCUTANEOUS | Status: DC

## 2019-10-26 MED ORDER — ACETAMINOPHEN 500 MG PO TABS: 1000.0000 mg | ORAL_TABLET | ORAL | Status: DC

## 2019-10-26 MED ORDER — PROPOFOL 10 MG/ML IV BOLUS
INTRAVENOUS | Status: DC | PRN
  Administered 2019-10-26: 160 mg via INTRAVENOUS

## 2019-10-26 MED ORDER — OXYCODONE HCL 5 MG PO TABS: 5.0000 mg | ORAL_TABLET | ORAL | 0 refills | Status: DC | PRN

## 2019-10-26 MED ORDER — SODIUM CHLORIDE 0.9 % IR SOLN
Status: DC | PRN
  Administered 2019-10-26: 500 mL

## 2019-10-26 MED ORDER — ZOLPIDEM TARTRATE 5 MG PO TABS: 5.0000 mg | ORAL_TABLET | Freq: Every evening | ORAL | Status: DC | PRN

## 2019-10-26 MED ORDER — SODIUM CHLORIDE (PF) 0.9 % IJ SOLN
INTRAMUSCULAR | Status: DC | PRN
  Administered 2019-10-26: 30 mL

## 2019-10-26 MED ORDER — IBUPROFEN 800 MG PO TABS: 800.0000 mg | ORAL_TABLET | Freq: Three times a day (TID) | ORAL | 0 refills | Status: DC | PRN

## 2019-10-26 MED ORDER — OXYCODONE HCL 5 MG PO TABS
5.0000 mg | ORAL_TABLET | ORAL | Status: DC | PRN
  Administered 2019-10-26: 5 mg via ORAL

## 2019-10-26 MED ORDER — ONDANSETRON HCL 4 MG PO TABS: 4.0000 mg | ORAL_TABLET | Freq: Four times a day (QID) | ORAL | 0 refills | Status: DC | PRN

## 2019-10-26 MED ORDER — GABAPENTIN 300 MG PO CAPS
300.0000 mg | ORAL_CAPSULE | ORAL | Status: AC
  Administered 2019-10-26: 300 mg via ORAL

## 2019-10-26 MED ORDER — HYDROMORPHONE HCL 1 MG/ML IJ SOLN
INTRAMUSCULAR | Status: DC | PRN
  Administered 2019-10-26 (×2): .5 mg via INTRAVENOUS

## 2019-10-26 MED ORDER — ACETAMINOPHEN 500 MG PO TABS: 1000.0000 mg | ORAL_TABLET | Freq: Four times a day (QID) | ORAL | 0 refills | Status: AC

## 2019-10-26 MED ORDER — KETOROLAC TROMETHAMINE 30 MG/ML IJ SOLN
30.0000 mg | Freq: Once | INTRAMUSCULAR | Status: AC
  Administered 2019-10-26: 30 mg via INTRAVENOUS

## 2019-10-26 MED ORDER — GABAPENTIN 300 MG PO CAPS
ORAL_CAPSULE | ORAL | Status: AC
  Filled 2019-10-26: qty 1

## 2019-10-26 MED ORDER — SUGAMMADEX SODIUM 200 MG/2ML IV SOLN
INTRAVENOUS | Status: DC | PRN
  Administered 2019-10-26: 200 mg via INTRAVENOUS

## 2019-10-26 MED ORDER — SCOPOLAMINE 1 MG/3DAYS TD PT72
MEDICATED_PATCH | TRANSDERMAL | Status: AC
  Filled 2019-10-26: qty 1

## 2019-10-26 MED ORDER — OXYCODONE HCL 5 MG PO TABS: 5.0000 mg | ORAL_TABLET | Freq: Once | ORAL | Status: DC | PRN

## 2019-10-26 MED ORDER — ROCURONIUM BROMIDE 10 MG/ML (PF) SYRINGE
PREFILLED_SYRINGE | INTRAVENOUS | Status: AC
  Filled 2019-10-26: qty 10

## 2019-10-26 MED ORDER — MIDAZOLAM HCL 2 MG/2ML IJ SOLN
INTRAMUSCULAR | Status: AC
  Filled 2019-10-26: qty 2

## 2019-10-26 MED ORDER — ACETAMINOPHEN 500 MG PO TABS
1000.0000 mg | ORAL_TABLET | Freq: Four times a day (QID) | ORAL | Status: DC
  Administered 2019-10-26: 1000 mg via ORAL

## 2019-10-26 MED ORDER — ACETAMINOPHEN 500 MG PO TABS
1000.0000 mg | ORAL_TABLET | Freq: Once | ORAL | Status: AC
  Administered 2019-10-26: 1000 mg via ORAL

## 2019-10-26 MED ORDER — IBUPROFEN 800 MG PO TABS: 800.0000 mg | ORAL_TABLET | Freq: Four times a day (QID) | ORAL | Status: DC

## 2019-10-26 MED ORDER — KCL IN DEXTROSE-NACL 20-5-0.45 MEQ/L-%-% IV SOLN
INTRAVENOUS | Status: DC
  Administered 2019-10-26: 125 mL/h via INTRAVENOUS
  Filled 2019-10-26 (×6): qty 1000

## 2019-10-26 SURGICAL SUPPLY — 67 items
APPLICATOR ARISTA FLEXITIP XL (MISCELLANEOUS) ×3 IMPLANT
BLADE SURG 10 STRL SS (BLADE) ×24 IMPLANT
CABLE HIGH FREQUENCY MONO STRZ (ELECTRODE) IMPLANT
CANISTER SUCT 3000ML PPV (MISCELLANEOUS) ×3 IMPLANT
CELL SAVER LIPIGURD (MISCELLANEOUS) IMPLANT
CNTNR URN SCR LID CUP LEK RST (MISCELLANEOUS) ×2 IMPLANT
CONT SPEC 4OZ STRL OR WHT (MISCELLANEOUS) ×2
COVER BACK TABLE 60X90IN (DRAPES) ×3 IMPLANT
COVER MAYO STAND STRL (DRAPES) ×3 IMPLANT
COVER WAND RF STERILE (DRAPES) ×3 IMPLANT
DECANTER SPIKE VIAL GLASS SM (MISCELLANEOUS) IMPLANT
DERMABOND ADVANCED (GAUZE/BANDAGES/DRESSINGS) ×1
DERMABOND ADVANCED .7 DNX12 (GAUZE/BANDAGES/DRESSINGS) ×2 IMPLANT
DRSG COVADERM PLUS 2X2 (GAUZE/BANDAGES/DRESSINGS) IMPLANT
DRSG OPSITE POSTOP 3X4 (GAUZE/BANDAGES/DRESSINGS) IMPLANT
DURAPREP 26ML APPLICATOR (WOUND CARE) ×3 IMPLANT
EXTRT SYSTEM ALEXIS 14CM (MISCELLANEOUS)
EXTRT SYSTEM ALEXIS 17CM (MISCELLANEOUS) ×3
GAUZE 4X4 16PLY RFD (DISPOSABLE) ×3 IMPLANT
GLOVE BIO SURGEON STRL SZ 6.5 (GLOVE) ×3 IMPLANT
GLOVE BIOGEL PI IND STRL 7.0 (GLOVE) ×8 IMPLANT
GLOVE BIOGEL PI INDICATOR 7.0 (GLOVE) ×4
GOWN STRL REUS W/TWL LRG LVL3 (GOWN DISPOSABLE) ×12 IMPLANT
HARMONIC RUM II 2.5CM SILVER (DISPOSABLE) ×3
HARMONIC RUM II 3.0CM SILVER (DISPOSABLE)
HARMONIC RUM II 3.5CM SILVER (DISPOSABLE)
HARMONIC RUM II 4.0CM SILVER (DISPOSABLE)
HEMOSTAT ARISTA ABSORB 3G PWDR (HEMOSTASIS) ×3 IMPLANT
LIGASURE VESSEL 5MM BLUNT TIP (ELECTROSURGICAL) ×3 IMPLANT
NEEDLE INSUFFLATION 120MM (ENDOMECHANICALS) ×3 IMPLANT
NEEDLE SPNL 22GX7 QUINCKE BK (NEEDLE) ×3 IMPLANT
PACK LAPAROSCOPY BASIN (CUSTOM PROCEDURE TRAY) ×3 IMPLANT
PACK TRENDGUARD 450 HYBRID PRO (MISCELLANEOUS) ×2 IMPLANT
POUCH LAPAROSCOPIC INSTRUMENT (MISCELLANEOUS) ×3 IMPLANT
PROTECTOR NERVE ULNAR (MISCELLANEOUS) ×6 IMPLANT
RETRACTOR WOUND ALXS 19CM XSML (INSTRUMENTS) ×2 IMPLANT
RTRCTR WOUND ALEXIS 19CM XSML (INSTRUMENTS) ×3
SCALPEL HRMNC RUM II 2.5 SILVR (DISPOSABLE) ×2 IMPLANT
SCALPEL HRMNC RUM II 3.0 SILVR (DISPOSABLE) IMPLANT
SCALPEL HRMNC RUM II 3.5 SILVR (DISPOSABLE) IMPLANT
SCALPEL HRMNC RUM II 4.0 SILVR (DISPOSABLE) IMPLANT
SCISSORS LAP 5X35 DISP (ENDOMECHANICALS) IMPLANT
SET IRRIG Y TYPE TUR BLADDER L (SET/KITS/TRAYS/PACK) ×3 IMPLANT
SET SUCTION IRRIG HYDROSURG (IRRIGATION / IRRIGATOR) ×3 IMPLANT
SET TRI-LUMEN FLTR TB AIRSEAL (TUBING) ×3 IMPLANT
SHEARS HARMONIC ACE PLUS 36CM (ENDOMECHANICALS) ×3 IMPLANT
SUT VIC AB 0 CT1 36 (SUTURE) ×3 IMPLANT
SUT VICRYL 0 UR6 27IN ABS (SUTURE) ×9 IMPLANT
SUT VICRYL 4-0 PS2 18IN ABS (SUTURE) ×3 IMPLANT
SUT VLOC 180 0 9IN  GS21 (SUTURE) ×2
SUT VLOC 180 0 9IN GS21 (SUTURE) ×2 IMPLANT
SYR 10ML LL (SYRINGE) ×3 IMPLANT
SYR 50ML LL SCALE MARK (SYRINGE) ×6 IMPLANT
SYSTEM CONTND EXTRCTN KII BLLN (MISCELLANEOUS) ×2 IMPLANT
TIP RUMI ORANGE 6.7MMX12CM (TIP) IMPLANT
TIP UTERINE 5.1X6CM LAV DISP (MISCELLANEOUS) IMPLANT
TIP UTERINE 6.7X10CM GRN DISP (MISCELLANEOUS) ×3 IMPLANT
TIP UTERINE 6.7X6CM WHT DISP (MISCELLANEOUS) IMPLANT
TIP UTERINE 6.7X8CM BLUE DISP (MISCELLANEOUS) IMPLANT
TOWEL OR 17X26 10 PK STRL BLUE (TOWEL DISPOSABLE) ×3 IMPLANT
TRAY FOLEY W/BAG SLVR 14FR (SET/KITS/TRAYS/PACK) ×3 IMPLANT
TRENDGUARD 450 HYBRID PRO PACK (MISCELLANEOUS) ×3
TROCAR ADV FIXATION 5X100MM (TROCAR) ×3 IMPLANT
TROCAR BLADELESS OPT 5 100 (ENDOMECHANICALS) ×3 IMPLANT
TROCAR PORT AIRSEAL 5X120 (TROCAR) ×3 IMPLANT
TROCAR XCEL NON BLADE 8MM B8LT (ENDOMECHANICALS) ×3 IMPLANT
WARMER LAPAROSCOPE (MISCELLANEOUS) ×3 IMPLANT

## 2019-10-26 NOTE — Discharge Instructions (Signed)
Post Anesthesia Home Care Instructions  Activity: Get plenty of rest for the remainder of the day. A responsible individual must stay with you for 24 hours following the procedure.  For the next 24 hours, DO NOT: -Drive a car -Operate machinery -Drink alcoholic beverages -Take any medication unless instructed by your physician -Make any legal decisions or sign important papers.  Meals: Start with liquid foods such as gelatin or soup. Progress to regular foods as tolerated. Avoid greasy, spicy, heavy foods. If nausea and/or vomiting occur, drink only clear liquids until the nausea and/or vomiting subsides. Call your physician if vomiting continues.  Special Instructions/Symptoms: Your throat may feel dry or sore from the anesthesia or the breathing tube placed in your throat during surgery. If this causes discomfort, gargle with warm salt water. The discomfort should disappear within 24 hours.  If you had a scopolamine patch placed behind your ear for the management of post- operative nausea and/or vomiting:  1. The medication in the patch is effective for 72 hours, after which it should be removed.  Wrap patch in a tissue and discard in the trash. Wash hands thoroughly with soap and water. 2. You may remove the patch earlier than 72 hours if you experience unpleasant side effects which may include dry mouth, dizziness or visual disturbances. 3. Avoid touching the patch. Wash your hands with soap and water after contact with the patch.    Laparoscopically Assisted Vaginal Hysterectomy, Care After This sheet gives you information about how to care for yourself after your procedure. Your health care provider may also give you more specific instructions. If you have problems or questions, contact your health care provider. What can I expect after the procedure? After the procedure, it is common to have:  Soreness and numbness in your incision areas.  Abdominal pain. You will be given pain  medicine to control it.  Vaginal bleeding and discharge. You will need to use a sanitary napkin after this procedure.  Sore throat from the breathing tube that was inserted during surgery. Follow these instructions at home: Medicines  Take over-the-counter and prescription medicines only as told by your health care provider.  Do not take aspirin or ibuprofen. These medicines can cause bleeding.  Do not drive or use heavy machinery while taking prescription pain medicine.  Do not drive for 24 hours if you were given a medicine to help you relax (sedative) during the procedure. Incision care   Follow instructions from your health care provider about how to take care of your incisions. Make sure you: ? Wash your hands with soap and water before you change your bandage (dressing). If soap and water are not available, use hand sanitizer. ? Change your dressing as told by your health care provider. ? Leave stitches (sutures), skin glue, or adhesive strips in place. These skin closures may need to stay in place for 2 weeks or longer. If adhesive strip edges start to loosen and curl up, you may trim the loose edges. Do not remove adhesive strips completely unless your health care provider tells you to do that.  Check your incision area every day for signs of infection. Check for: ? Redness, swelling, or pain. ? Fluid or blood. ? Warmth. ? Pus or a bad smell. Activity  Get regular exercise as told by your health care provider. You may be told to take short walks every day and go farther each time.  Return to your normal activities as told by your health care   provider. Ask your health care provider what activities are safe for you.  Do not douche, use tampons, or have sexual intercourse for at least 6 weeks, or until your health care provider gives you permission.  Do not lift anything that is heavier than 10 lb (4.5 kg), or the limit that your health care provider tells you, until he or  she says that it is safe. General instructions  Do not take baths, swim, or use a hot tub until your health care provider approves. Take showers instead of baths.  Do not drive for 24 hours if you received a sedative.  Do not drive or operate heavy machinery while taking prescription pain medicine.  To prevent or treat constipation while you are taking prescription pain medicine, your health care provider may recommend that you: ? Drink enough fluid to keep your urine clear or pale yellow. ? Take over-the-counter or prescription medicines. ? Eat foods that are high in fiber, such as fresh fruits and vegetables, whole grains, and beans. ? Limit foods that are high in fat and processed sugars, such as fried and sweet foods.  Keep all follow-up visits as told by your health care provider. This is important. Contact a health care provider if:  You have signs of infection, such as: ? Redness, swelling, or pain around your incision sites. ? Fluid or blood coming from an incision. ? An incision that feels warm to the touch. ? Pus or a bad smell coming from an incision.  Your incision breaks open.  Your pain medicine is not helping.  You feel dizzy or light-headed.  You have pain or bleeding when you urinate.  You have persistent nausea and vomiting.  You have blood, pus, or a bad-smelling discharge from your vagina. Get help right away if:  You have a fever.  You have severe abdominal pain.  You have chest pain.  You have shortness of breath.  You faint.  You have pain, swelling, or redness in your leg.  You have heavy bleeding from your vagina. Summary  After the procedure, it is common to have abdominal pain and vaginal bleeding.  You should not drive or lift heavy objects until your health care provider says that it is safe.  Contact your health care provider if you have any symptoms of infection, excessive vaginal bleeding, nausea, vomiting, or shortness of  breath. This information is not intended to replace advice given to you by your health care provider. Make sure you discuss any questions you have with your health care provider. Document Revised: 04/12/2017 Document Reviewed: 06/26/2016 Elsevier Patient Education  2020 [employer]  

## 2019-10-26 NOTE — Op Note (Signed)
Preoperative Diagnosis: Symptomatic fibroid uterus  Postoperative Diagnosis: Same  Procedure:  Total Laparoscopic Hysterectomy with bilateral salpingectomies and cystoscopy  Surgeon: Dr Sumner Boast  Assistant: Dr Edwinna Areola, an MD assistant was necessary for tissue manipulation, retraction and positioning due to the complexity of the case and hospital policies  Anesthesia: General  Uterine Weight: 1,366 grams  EBL: 70  Fluids: 1,400  Urine output: 485  Complications: none  Indications for surgery: The patient is a 44 year old female, who presented with worsening bulk symptoms and a known fibroid uterus. She has a history of menorrhagia and dysmenorrhea. Work up included an ultrasound on 07/27/19 where her uterus measured 16.78 x 13.2 x 9.83 cm compared to 10/02/18 ultrasound where her uterus measured 14.98 x 11.59 x 8.32 cm. She had a benign endometrial biopsy in 5/20, negative pap and HPV in 3/21. She does have mild anemia with a preoperative hgb of 10.8 gm/dl. The patient is aware of the risks and complications involved with the surgery and consent was obtained prior to the procedure.  Findings: 20 week sized uterus, normal adnexa bilaterally, normal liver edge. Normal bladder mucosa, normal ureteral jets bilaterally.   Procedure: The patient was taken to the operating room with an IV in placed, preoperative antibiotics had been administered. She was placed in the dorsal lithotomy position. General anesthesia was administered. She was prepped and draped in the usual sterile fashion for an abdominal, vaginal surgery. A rumi uterine manipulator was placed, using a # 2.5 cup and a 10 cm extender. A foley catheter was placed.   An oral gastric tube was placed and the abdomen was marked with a marking pen. Local was placed and an incision was made in the midclavicular line on the left 2-3 cm below the costal edge. A #5 optiview trocar was inserted with direct observation with the  laparoscope.  The abdominal cavity was insufflated with CO2. The patient was placed in trendelenburg and the abdominal pelvic cavity was inspected. The umbilicus was everted, injected with 0.25% marcaine and incised with a # 11 blade. A #8 trocar was inserted through the umbilical incision.  2 more trocars were placed: 1 in each lateral mid abdomen. These areas were injected with 0.25% marcaine, incised with a #11 blade and both trocars were inserted with direct visualization with the laparoscope. A # 5 airseal trocar was placed on the right, and a 5 mm trocar on the left. The abdominal pelvic cavity was again inspected. A mixture of 30 cc of Robivacaine and 30 cc of NS was place in the pelvic cavity. The ureters were identified bilaterally.  The left tube was elevated from the pelvic sidewall, cauterized and cut with the ligasure device. The mesosalpinx was cauterized and cut with the ligasure device. The tube was separated from the uterus using the ligasure device and removed through the midline trocar. The left uterine ovarian ligament was cauterized and cut with the ligasure device. The left round ligament was cauterized and cut with the ligasure device and the anterior and posterior leafs of the broad ligament were taken down with the ligasure device. The harmonic scalpel was then used to take down the bladder flap and skeltonize the vessels. The left uterine vessels were then clamped, cauterized and ligated with the ligasure device. Hemostasis was excellent. The same procedure was repeated on the right. Given the large size of the uterus the 30 degree scope was used to help with visualization.   Using the [facility] manipulator the uterus  was pushed up in the pelvic cavity and the harmonic scalpel was used to separate the cervix from the vagina using the harmonic energy. The rumi manipulator was removed and the large alexis bag was passed through the vagina into the abdominal cavity. The uterus was placed in  the bag. An occluder was placed in the vagina to maintain pneumoperitoneum. The vaginal cuff was then closed with a 0 V-lock suture. Hemostasis was excellent.   The umbilical trocar was removed and the incision was enlarged a few cm on either side using a #10 blade. The edge of the alexis bag was brought through the incision. The uterus was morcellated in the bag and removed through the umbilical incision. The bag was removed intact. The abdominal pelvic cavity was irrigated and suctioned dry. Pressure was released and hemostasis remained excellent.   The abdominal cavity was desufflated and the trocars were removed. The umbilical fascial incision was closed with 2 running stitches of 0 Vicryl, tied in the middle. The skin was closed with subcuticular stiches of 4-0 vicryl and dermabond was placed over the incisions.  The foley catheter was removed and cystoscopy was performed using a 70 degree scope. Both ureters expelled urine, no bladder abnormalities were noted. The bladder was allowed to drain and the cystoscope was removed. The vagina was inspected, hemostasis was excellent.   The patient's abdomen and perineum were cleansed and she was taken out of the dorsal lithotomy position. Upon awakening she was extubated and taken to the recovery room in stable condition. The sponge and instrument counts were correct.   Given the size of the uterus the surgery took ~3 hours.

## 2019-10-26 NOTE — Progress Notes (Signed)
Pt has had several episodes of nausea today. Dr. Nelson Chimes made aware, pt had dinner without nausea and is requesting to be discharged home tonight. Okay to discharge home.

## 2019-10-26 NOTE — Interval H&P Note (Signed)
History and Physical Interval Note:  10/26/2019 8:27 AM  Gillis Santa  has presented today for surgery, with the diagnosis of Uterine leiomyoma, Enlarged uterus.  The various methods of treatment have been discussed with the patient and family. After consideration of risks, benefits and other options for treatment, the patient has consented to  Procedure(s) with comments: TOTAL LAPAROSCOPIC HYSTERECTOMY WITH SALPINGECTOMY (N/A) - to follow first case CYSTOSCOPY (N/A) as a surgical intervention.  The patient's history has been reviewed, patient examined, no change in status, stable for surgery.  I have reviewed the patient's chart and labs.  Questions were answered to the patient's satisfaction.     Salvadore Dom

## 2019-10-26 NOTE — Transfer of Care (Signed)
Immediate Anesthesia Transfer of Care Note  Patient: Janice Thomas  Procedure(s) Performed: TOTAL LAPAROSCOPIC HYSTERECTOMY WITH SALPINGECTOMY (N/A Abdomen) CYSTOSCOPY (N/A Bladder)  Patient Location: PACU  Anesthesia Type:General  Level of Consciousness: awake, oriented, drowsy and patient cooperative  Airway & Oxygen Therapy: Patient Spontanous Breathing and Patient connected to face mask oxygen  Post-op Assessment: Report given to RN and Post -op Vital signs reviewed and stable  Post vital signs: Reviewed and stable  Last Vitals:  Vitals Value Taken Time  BP 126/75 10/26/19 1302  Temp    Pulse 83 10/26/19 1304  Resp 18 10/26/19 1307  SpO2 100 % 10/26/19 1304  Vitals shown include unvalidated device data.  Last Pain:  Vitals:   10/26/19 1302  TempSrc:   PainSc: (P) Asleep      Patients Stated Pain Goal: 5 (66/29/47 6546)  Complications: No complications documented.

## 2019-10-26 NOTE — Anesthesia Postprocedure Evaluation (Signed)
Anesthesia Post Note  Patient: Janice Thomas  Procedure(s) Performed: TOTAL LAPAROSCOPIC HYSTERECTOMY WITH SALPINGECTOMY (N/A Abdomen) CYSTOSCOPY (N/A Bladder)     Patient location during evaluation: PACU Anesthesia Type: General Level of consciousness: awake and alert and oriented Pain management: pain level controlled Vital Signs Assessment: post-procedure vital signs reviewed and stable Respiratory status: spontaneous breathing, nonlabored ventilation and respiratory function stable Cardiovascular status: blood pressure returned to baseline Postop Assessment: no apparent nausea or vomiting Anesthetic complications: no   No complications documented.  Last Vitals:  Vitals:   10/26/19 1315 10/26/19 1330  BP: 125/76 129/70  Pulse: 82   Resp: 16 16  Temp:    SpO2: 100% 100%    Last Pain:  Vitals:   10/26/19 1330  TempSrc:   PainSc: [place]

## 2019-10-26 NOTE — Discharge Summary (Signed)
Physician Discharge Summary  Patient ID: Janice Thomas MRN: [id number] DOB/AGE: [birth year] 44 y.o.  Admit date: 10/26/2019 Discharge date: 10/26/2019  Admission Diagnoses: Symptomatic fibroid uterus  Discharge Diagnoses:  Active Problems:   Status post laparoscopic hysterectomy   S/P laparoscopic hysterectomy   Discharged Condition: good  Hospital Course: uncomplicated  Consults: None  Significant Diagnostic Studies: labs:  Lab Results  Component Value Date   WBC 12.4 (H) 10/26/2019   HGB 11.6 (L) 10/26/2019   HCT 37.3 10/26/2019   MCV 85.7 10/26/2019   PLT 231 10/26/2019     Treatments: surgery: total laparoscopic hysterectomy, bilateral salpingectomies, cystoscopy  Discharge Exam: Blood pressure 130/71, pulse 85, temperature 97.6 F (36.4 C), resp. rate 15, height 5\' 5"  (1.651 m), weight 75.3 kg, last menstrual period 09/28/2019, SpO2 99 %. See post op visit  Disposition: Discharge disposition: 01-Home or Self Care       Discharge Instructions    Call MD for:   Complete by: As directed    Heavy vaginal bleeding   Call MD for:  difficulty breathing, headache or visual disturbances   Complete by: As directed    Call MD for:  extreme fatigue   Complete by: As directed    Call MD for:  hives   Complete by: As directed    Call MD for:  persistant dizziness or light-headedness   Complete by: As directed    Call MD for:  persistant nausea and vomiting   Complete by: As directed    Call MD for:  redness, tenderness, or signs of infection (pain, swelling, redness, odor or green/yellow discharge around incision site)   Complete by: As directed    Call MD for:  severe uncontrolled pain   Complete by: As directed    Call MD for:  temperature >100.4   Complete by: As directed    Diet general   Complete by: As directed    Driving Restrictions   Complete by: As directed    No driving while taking narcotics   Increase activity slowly   Complete by: As  directed    No dressing needed   Complete by: As directed    Sexual Activity Restrictions   Complete by: As directed    No intercourse for 12 weeks     Allergies as of 10/26/2019   No Known Allergies     Medication List    STOP taking these medications   IRON PO   MULTIVITAMIN PO     TAKE these medications   acetaminophen 500 MG tablet Commonly known as: TYLENOL Take 2 tablets (1,000 mg total) by mouth every 6 (six) hours.   docusate sodium 100 MG capsule Commonly known as: COLACE Take 1 capsule (100 mg total) by mouth 2 (two) times daily.   ibuprofen 800 MG tablet Commonly known as: ADVIL Take 1 tablet (800 mg total) by mouth every 8 (eight) hours as needed.   ondansetron 4 MG tablet Commonly known as: ZOFRAN Take 1 tablet (4 mg total) by mouth every 6 (six) hours as needed for nausea.   oxyCODONE 5 MG immediate release tablet Commonly known as: Oxy IR/ROXICODONE Take 1-2 tablets (5-10 mg total) by mouth every 4 (four) hours as needed for moderate pain.            Discharge Care Instructions  (From admission, onward)         Start     Ordered   10/26/19 0000  No dressing needed  10/26/19 1930           Signed: Salvadore Dom 10/26/2019, 7:31 PM

## 2019-10-26 NOTE — Anesthesia Procedure Notes (Signed)
Procedure Name: Intubation Date/Time: 10/26/2019 9:22 AM Performed by: Garrel Ridgel, CRNA Pre-anesthesia Checklist: Patient identified, Emergency Drugs available, Suction available and Patient being monitored Patient Re-evaluated:Patient Re-evaluated prior to induction Oxygen Delivery Method: Circle system utilized Preoxygenation: Pre-oxygenation with 100% oxygen Induction Type: IV induction Ventilation: Mask ventilation without difficulty Laryngoscope Size: Mac and 3 Grade View: Grade I Tube type: Oral Tube size: 7.0 mm Number of attempts: 1 Airway Equipment and Method: Stylet and Oral airway Placement Confirmation: ETT inserted through vocal cords under direct vision,  positive ETCO2 and breath sounds checked- equal and bilateral Tube secured with: Tape Dental Injury: Teeth and Oropharynx as per pre-operative assessment

## 2019-10-26 NOTE — Progress Notes (Signed)
Day of Surgery Procedure(s) (LRB): TOTAL LAPAROSCOPIC HYSTERECTOMY WITH SALPINGECTOMY (N/A) CYSTOSCOPY (N/A)  Subjective: Patient reports that she is doing well. Ambulating, voiding, pain 2/10 in severity. She did have an episode of emesis recently. No longer feeling nausea.   Objective: I have reviewed patient's vital signs, intake and output and labs.  Today's Vitals   10/26/19 1530 10/26/19 1538 10/26/19 1615 10/26/19 1700  BP:  121/68    Pulse:  84    Resp:  16    Temp:  (!) 97.5 F (36.4 C)    TempSrc:      SpO2:  100%    Weight:      Height:      PainSc: 6  1  2  1     No intake/output data recorded. Total I/O In: 2306.7 [P.O.:240; I.V.:2066.7] Out: 995 [Urine:700; Emesis/NG output:225; Blood:70]   CBC Latest Ref Rng & Units 10/26/2019 10/15/2019 06/11/2019  WBC 4.0 - 10.5 K/uL 12.4(H) 4.6 5.0  Hemoglobin 12.0 - 15.0 g/dL 11.6(L) 10.8(L) 11.6  Hematocrit 36 - 46 % 37.3 34.9(L) 35.7  Platelets 150 - 400 K/uL 231 342 314     General: alert, cooperative and no distress Resp: clear to auscultation bilaterally Cardio: S1, S2 normal GI: soft, not tender, +BS, mildly distended. Incisions: clean, dry and intact without erythema Extremities: extremities normal, atraumatic, no cyanosis or edema  Assessment: s/p Procedure(s) with comments: TOTAL LAPAROSCOPIC HYSTERECTOMY WITH SALPINGECTOMY (N/A) - to follow first case CYSTOSCOPY (N/A): stable and progressing well  Plan: Advance diet Encourage ambulation  Will see if she tolerates diner, if so may d/c later today otherwise anticipate d/c in the am  LOS: 0 days    Salvadore Dom 10/26/2019, 5:43 PM

## 2019-10-27 ENCOUNTER — Encounter (HOSPITAL_BASED_OUTPATIENT_CLINIC_OR_DEPARTMENT_OTHER): Payer: Self-pay | Admitting: Obstetrics and Gynecology

## 2019-10-27 ENCOUNTER — Telehealth: Payer: Self-pay | Admitting: Obstetrics and Gynecology

## 2019-10-27 LAB — SURGICAL PATHOLOGY

## 2019-10-27 NOTE — Telephone Encounter (Signed)
Called patient to check on her and review pathology report. Per DPR message left. Mychart message also sent.

## 2019-10-30 ENCOUNTER — Other Ambulatory Visit: Payer: Self-pay

## 2019-11-03 ENCOUNTER — Ambulatory Visit (INDEPENDENT_AMBULATORY_CARE_PROVIDER_SITE_OTHER): Payer: 59 | Admitting: Obstetrics and Gynecology

## 2019-11-03 ENCOUNTER — Encounter: Payer: Self-pay | Admitting: Obstetrics and Gynecology

## 2019-11-03 ENCOUNTER — Other Ambulatory Visit: Payer: Self-pay

## 2019-11-03 VITALS — BP 122/64 | HR 72 | Temp 97.5°F | Ht 65.0 in | Wt 166.0 lb

## 2019-11-03 DIAGNOSIS — Z90710 Acquired absence of both cervix and uterus: Secondary | ICD-10-CM

## 2019-11-03 NOTE — Progress Notes (Signed)
GYNECOLOGY  VISIT   HPI: 44 y.o.   Single Black or African American Not Hispanic or Latino  female   [phone] with Patient's last menstrual period was 09/28/2019.   here for  1 week post op s/p TLH/BS for a large fibroid uterus. Pathology was benign.  Patient states that her pain is not that bad. She says that the only thing that hurts is belly button.   She is taking ibuprofen every 8 hours. Normal bowel and bladder function.   GYNECOLOGIC HISTORY: Patient's last menstrual period was 09/28/2019. Contraception:none  Menopausal hormone therapy: none         OB History    Gravida  3   Para  2   Term  2   Preterm      AB  1   Living  2     SAB  1   TAB      Ectopic      Multiple      Live Births  2              Patient Active Problem List   Diagnosis Date Noted  . Status post laparoscopic hysterectomy 10/26/2019  . S/P laparoscopic hysterectomy 10/26/2019  . Iron deficiency anemia due to chronic blood loss 10/27/2018  . Menometrorrhagia 10/27/2018  . Enlarged uterus 06/30/2014  . Submucous leiomyoma of uterus 06/30/2014  . Intramural leiomyoma of uterus 06/30/2014    Past Medical History:  Diagnosis Date  . Abnormal Pap smear of cervix 12/11   LGSIL  . Anemia   . Hyperlipidemia   . Menometrorrhagia 10/27/2018  . Vaginal delivery 1998, 2003    Past Surgical History:  Procedure Laterality Date  . COLPOSCOPY  1/12   mild dysplasia CIN1, HPV effect  . CYSTOSCOPY N/A 10/26/2019   Procedure: CYSTOSCOPY;  Surgeon: Salvadore Dom, MD;  Location: [facility];  Service: Gynecology;  Laterality: N/A;  . DILATION AND CURETTAGE OF UTERUS    . TOTAL LAPAROSCOPIC HYSTERECTOMY WITH SALPINGECTOMY N/A 10/26/2019   Procedure: TOTAL LAPAROSCOPIC HYSTERECTOMY WITH SALPINGECTOMY;  Surgeon: Salvadore Dom, MD;  Location: [facility];  Service: Gynecology;  Laterality: N/A;  to follow first case    Current Outpatient Medications   Medication Sig Dispense Refill  . acetaminophen (TYLENOL) 500 MG tablet Take 2 tablets (1,000 mg total) by mouth every 6 (six) hours. 30 tablet 0  . docusate sodium (COLACE) 100 MG capsule Take 1 capsule (100 mg total) by mouth 2 (two) times daily. 30 capsule 0  . ibuprofen (ADVIL) 800 MG tablet Take 1 tablet (800 mg total) by mouth every 8 (eight) hours as needed. 30 tablet 0   No current facility-administered medications for this visit.     ALLERGIES: Patient has no known allergies.  Family History  Problem Relation Age of Onset  . Thyroid disease Mother   . Hypertension Mother   . Diabetes Mother   . Heart attack Mother   . Sickle cell anemia Sister   . Deep vein thrombosis Sister     Social History   Socioeconomic History  . Marital status: Single    Spouse name: Not on file  . Number of children: Not on file  . Years of education: Not on file  . Highest education level: Not on file  Occupational History  . Not on file  Tobacco Use  . Smoking status: Never Smoker  . Smokeless tobacco: Never Used  Vaping Use  . Vaping Use:  Never used  Substance and Sexual Activity  . Alcohol use: No  . Drug use: No  . Sexual activity: Yes    Partners: Male    Birth control/protection: Condom  Other Topics Concern  . Not on file  Social History Narrative  . Not on file   Social Determinants of Health   Financial Resource Strain:   . Difficulty of Paying Living Expenses:   Food Insecurity:   . Worried About Charity fundraiser in the Last Year:   . Arboriculturist in the Last Year:   Transportation Needs:   . Film/video editor (Medical):   Marland Kitchen Lack of Transportation (Non-Medical):   Physical Activity:   . Days of Exercise per Week:   . Minutes of Exercise per Session:   Stress:   . Feeling of Stress :   Social Connections:   . Frequency of Communication with Friends and Family:   . Frequency of Social Gatherings with Friends and Family:   . Attends Religious  Services:   . Active Member of Clubs or Organizations:   . Attends Archivist Meetings:   Marland Kitchen Marital Status:   Intimate Partner Violence:   . Fear of Current or Ex-Partner:   . Emotionally Abused:   Marland Kitchen Physically Abused:   . Sexually Abused:     Review of Systems  All other systems reviewed and are negative.   PHYSICAL EXAMINATION:    BP 122/64   Pulse 72   Temp (!) 97.5 F (36.4 C)   Ht 5\' 5"  (1.651 m)   Wt 166 lb (75.3 kg)   LMP 09/28/2019   SpO2 100%   BMI 27.62 kg/m     General appearance: alert, cooperative and appears stated age Abdomen: soft, non-tender; non distended, no masses,  no organomegaly Incisions: healing well   ASSESSMENT 1 week post op s/p TLH/BS with large fibroid uterus. Doing very well    PLAN F/U in 3 weeks, call with any concerns

## 2019-11-24 ENCOUNTER — Encounter: Payer: Self-pay | Admitting: Obstetrics and Gynecology

## 2019-11-24 ENCOUNTER — Telehealth: Payer: Self-pay

## 2019-11-24 ENCOUNTER — Other Ambulatory Visit: Payer: Self-pay

## 2019-11-24 ENCOUNTER — Ambulatory Visit (INDEPENDENT_AMBULATORY_CARE_PROVIDER_SITE_OTHER): Payer: 59 | Admitting: Obstetrics and Gynecology

## 2019-11-24 VITALS — BP 130/68 | HR 77 | Ht 65.0 in | Wt 164.0 lb

## 2019-11-24 DIAGNOSIS — Z90710 Acquired absence of both cervix and uterus: Secondary | ICD-10-CM

## 2019-11-24 NOTE — Progress Notes (Signed)
GYNECOLOGY  VISIT   HPI: 44 y.o.   Single Black or African American Not Hispanic or Latino  female   [phone] with Patient's last menstrual period was 09/28/2019.   here for 4 week post opt after total laparoscopic hysterectomy, bilateral salpingectomies. She has some mild pain in her belly button area.  Normal bowel and bladder function.  She has to do heavy lifting at work.   GYNECOLOGIC HISTORY: Patient's last menstrual period was 09/28/2019. Contraception:none  Menopausal hormone therapy: none        OB History    Gravida  3   Para  2   Term  2   Preterm      AB  1   Living  2     SAB  1   TAB      Ectopic      Multiple      Live Births  2              Patient Active Problem List   Diagnosis Date Noted  . Status post laparoscopic hysterectomy 10/26/2019  . S/P laparoscopic hysterectomy 10/26/2019  . Iron deficiency anemia due to chronic blood loss 10/27/2018  . Menometrorrhagia 10/27/2018  . Enlarged uterus 06/30/2014  . Submucous leiomyoma of uterus 06/30/2014  . Intramural leiomyoma of uterus 06/30/2014    Past Medical History:  Diagnosis Date  . Abnormal Pap smear of cervix 12/11   LGSIL  . Anemia   . Hyperlipidemia   . Menometrorrhagia 10/27/2018  . Vaginal delivery 1998, 2003    Past Surgical History:  Procedure Laterality Date  . COLPOSCOPY  1/12   mild dysplasia CIN1, HPV effect  . CYSTOSCOPY N/A 10/26/2019   Procedure: CYSTOSCOPY;  Surgeon: Salvadore Dom, MD;  Location: [facility];  Service: Gynecology;  Laterality: N/A;  . DILATION AND CURETTAGE OF UTERUS    . TOTAL LAPAROSCOPIC HYSTERECTOMY WITH SALPINGECTOMY N/A 10/26/2019   Procedure: TOTAL LAPAROSCOPIC HYSTERECTOMY WITH SALPINGECTOMY;  Surgeon: Salvadore Dom, MD;  Location: [facility];  Service: Gynecology;  Laterality: N/A;  to follow first case    Current Outpatient Medications  Medication Sig Dispense Refill  . acetaminophen  (TYLENOL) 500 MG tablet Take 2 tablets (1,000 mg total) by mouth every 6 (six) hours. 30 tablet 0   No current facility-administered medications for this visit.     ALLERGIES: Patient has no known allergies.  Family History  Problem Relation Age of Onset  . Thyroid disease Mother   . Hypertension Mother   . Diabetes Mother   . Heart attack Mother   . Sickle cell anemia Sister   . Deep vein thrombosis Sister     Social History   Socioeconomic History  . Marital status: Single    Spouse name: Not on file  . Number of children: Not on file  . Years of education: Not on file  . Highest education level: Not on file  Occupational History  . Not on file  Tobacco Use  . Smoking status: Never Smoker  . Smokeless tobacco: Never Used  Vaping Use  . Vaping Use: Never used  Substance and Sexual Activity  . Alcohol use: No  . Drug use: No  . Sexual activity: Yes    Partners: Male    Birth control/protection: Condom  Other Topics Concern  . Not on file  Social History Narrative  . Not on file   Social Determinants of Health   Financial Resource Strain:   .  Difficulty of Paying Living Expenses:   Food Insecurity:   . Worried About Charity fundraiser in the Last Year:   . Arboriculturist in the Last Year:   Transportation Needs:   . Film/video editor (Medical):   Marland Kitchen Lack of Transportation (Non-Medical):   Physical Activity:   . Days of Exercise per Week:   . Minutes of Exercise per Session:   Stress:   . Feeling of Stress :   Social Connections:   . Frequency of Communication with Friends and Family:   . Frequency of Social Gatherings with Friends and Family:   . Attends Religious Services:   . Active Member of Clubs or Organizations:   . Attends Archivist Meetings:   Marland Kitchen Marital Status:   Intimate Partner Violence:   . Fear of Current or Ex-Partner:   . Emotionally Abused:   Marland Kitchen Physically Abused:   . Sexually Abused:     Review of Systems  All  other systems reviewed and are negative.   PHYSICAL EXAMINATION:    BP 130/68   Pulse 77   Ht 5\' 5"  (1.651 m)   Wt 164 lb (74.4 kg)   LMP 09/28/2019   SpO2 99%   BMI 27.29 kg/m     General appearance: alert, cooperative and appears stated age Abdomen: soft, mildly tender around her umbilicus; non distended, no masses,  no organomegaly Incisions: healing well  Pelvic: External genitalia:  no lesions              Urethra:  normal appearing urethra with no masses, tenderness or lesions              Bartholins and Skenes: normal                 Vagina: normal appearing vagina with normal color and discharge, no lesions. Vaginal cuff is healing well, not tender              Cervix: absent              Bimanual Exam:  Uterus:  uterus absent              Adnexa: no mass, fullness, tenderness               Chaperone was present for exam.  ASSESSMENT 4 weeks s/p TLH/BS of a large fibroid uterus, overall doing well. She still has some pain around her umbilicus (largest incision)    PLAN Routine f/u At the moment the plan is for her to go back to work at 6 weeks post op, she will call next week if she doesn't feel she is able to do the heavy lifting required of her job.

## 2019-11-24 NOTE — Telephone Encounter (Signed)
[place] office called in regards to get the return to work date for patient. Return call to Cristine Polio at [phone] ext. [phone].

## 2019-11-25 NOTE — Telephone Encounter (Signed)
Call placed to [place] and left message for Estill Bamberg to return call to triage RN.

## 2019-11-30 NOTE — Telephone Encounter (Signed)
Spoke with patient. Patient would like to extend her FMLA by 2 weeks due to lifting at work and still having abdominal tenderness. This was approved by Dr.Jertson. Advised will notify [employer] of extension to 12/21/2019.  Left message for Estill Bamberg with [employer] to return call to discuss.

## 2019-12-02 NOTE — Telephone Encounter (Signed)
Spoke with Venetia Night at [employer] advising that patient's return to work date will need to be extended to 12/21/2019. Venetia Night states she will document this in the patient's case and our office will be contacted if anything additional is needed.  Routing to provider and will close encounter.

## 2019-12-02 NOTE — Telephone Encounter (Signed)
[employer] left message in regards to speak with nurse about patients short term disability leave. Stated to return call to [phone].

## 2020-02-16 ENCOUNTER — Other Ambulatory Visit: Payer: Self-pay | Admitting: Podiatry

## 2020-02-16 ENCOUNTER — Ambulatory Visit (INDEPENDENT_AMBULATORY_CARE_PROVIDER_SITE_OTHER): Payer: 59 | Admitting: Podiatry

## 2020-02-16 ENCOUNTER — Other Ambulatory Visit: Payer: Self-pay

## 2020-02-16 ENCOUNTER — Ambulatory Visit (INDEPENDENT_AMBULATORY_CARE_PROVIDER_SITE_OTHER): Payer: 59

## 2020-02-16 VITALS — BP 139/89 | HR 78 | Temp 97.9°F

## 2020-02-16 DIAGNOSIS — M2012 Hallux valgus (acquired), left foot: Secondary | ICD-10-CM

## 2020-02-16 DIAGNOSIS — M79675 Pain in left toe(s): Secondary | ICD-10-CM

## 2020-02-16 DIAGNOSIS — M2041 Other hammer toe(s) (acquired), right foot: Secondary | ICD-10-CM

## 2020-02-16 DIAGNOSIS — M79674 Pain in right toe(s): Secondary | ICD-10-CM | POA: Diagnosis not present

## 2020-02-16 DIAGNOSIS — L601 Onycholysis: Secondary | ICD-10-CM | POA: Diagnosis not present

## 2020-02-16 DIAGNOSIS — M2011 Hallux valgus (acquired), right foot: Secondary | ICD-10-CM

## 2020-02-16 NOTE — Progress Notes (Signed)
  Subjective:  Patient ID: Janice Thomas, female    DOB: 1975-09-21,  MRN: [id number]  Chief Complaint  Patient presents with  . Nail Problem    Great toe left foot nail lifting and discolored   . Callouses    5th toe bilateral   . Bunions    Possible bilateral     44 y.o. female presents with the above complaint. History confirmed with patient.  Has had bunions that cause her pain especially when she is on her feet.  Works on her feet working for [employer].  States that she has pain that is preventing her from standing all day.  Also complains of loosening to the left great toenail unsure of injury  Objective:  Physical Exam: warm, good capillary refill, no trophic changes or ulcerative lesions, normal DP and PT pulses and normal sensory exam. Left Foot: Hallux valgus deformity with pain to palpation about the medial eminence, left hallux nail thickened partial lysis with subungual debris Right Foot: Hallux valgus deformity pain to palpation, fifth hammertoe with lateral PIPJ corn and medial MPJ corn  No images are attached to the encounter.  Radiographs: X-ray of both feet: no fracture, dislocation, swelling or degenerative changes noted and Haglund deformity noted bilateral right greater than left Assessment:   1. Acquired hallux valgus of both feet   2. Hammer toe of right foot   3. Pain in toes of both feet   4. Onycholysis    Plan:  Patient was evaluated and treated and all questions answered.  Bunion -XR reviewed with patient -Educated on etiology of deformity -Discussed proper shoe gear modifications and padding  -Patient has failed all conservative therapy and wishes to proceed with surgical intervention. All risks, benefits, and alternatives discussed with patient. No guarantees given. Consent reviewed and signed by patient. -Planned procedures: Correction left foot bunion with cutting and possible shifting of bone -DME dispensed: CAM walker boot for postoperative  use  Hallux nail partial lysis -Gently debrided of loose nail.  Underlying nail appears healthy and viable  No follow-ups on file.

## 2020-04-05 ENCOUNTER — Encounter: Payer: Self-pay | Admitting: Physician Assistant

## 2020-04-05 ENCOUNTER — Other Ambulatory Visit: Payer: Self-pay

## 2020-04-05 ENCOUNTER — Ambulatory Visit (INDEPENDENT_AMBULATORY_CARE_PROVIDER_SITE_OTHER): Payer: 59 | Admitting: Physician Assistant

## 2020-04-05 VITALS — BP 160/100 | HR 77 | Temp 98.3°F | Ht 65.0 in | Wt 168.4 lb

## 2020-04-05 DIAGNOSIS — E663 Overweight: Secondary | ICD-10-CM

## 2020-04-05 DIAGNOSIS — M79641 Pain in right hand: Secondary | ICD-10-CM

## 2020-04-05 DIAGNOSIS — Z0001 Encounter for general adult medical examination with abnormal findings: Secondary | ICD-10-CM

## 2020-04-05 DIAGNOSIS — Z114 Encounter for screening for human immunodeficiency virus [HIV]: Secondary | ICD-10-CM

## 2020-04-05 DIAGNOSIS — M79642 Pain in left hand: Secondary | ICD-10-CM

## 2020-04-05 DIAGNOSIS — Z1211 Encounter for screening for malignant neoplasm of colon: Secondary | ICD-10-CM

## 2020-04-05 DIAGNOSIS — M79662 Pain in left lower leg: Secondary | ICD-10-CM

## 2020-04-05 DIAGNOSIS — R030 Elevated blood-pressure reading, without diagnosis of hypertension: Secondary | ICD-10-CM

## 2020-04-05 DIAGNOSIS — E785 Hyperlipidemia, unspecified: Secondary | ICD-10-CM | POA: Insufficient documentation

## 2020-04-05 DIAGNOSIS — E559 Vitamin D deficiency, unspecified: Secondary | ICD-10-CM | POA: Insufficient documentation

## 2020-04-05 NOTE — Progress Notes (Signed)
I acted as a Education administrator for [employer], PA-C Anselmo Pickler, LPN     Subjective:    Janice Thomas is a 44 y.o. female and is here for a comprehensive physical exam.   HPI  There are no preventive care reminders to display for this patient.  Acute Concerns: Bilateral hand pain -- has been going on for 1-2 weeks. Took some OTC calcium and potassium per recommendation of her pharmacist. This maybe helped for a few days. No family or personal hx of arthritis. Has not had weakness, numbness, tingling, rashes, lesions. L shin pain -- has a tender nodule on her shin. Has been present for 3 weeks. Doesn't remember hitting her leg. Planning on L foot bunion surgery soon. Does get slight radiation of pain down her ankle, but not towards calf.  Chronic Issues: Elevated blood pressure reading -- Currently taking no medication. At home blood pressure readings are: not checked. Patient denies chest pain, SOB, blurred vision, dizziness, unusual headaches, lower leg swelling. Denies excessive caffeine intake, stimulant usage, excessive alcohol intake, or increase in salt consumption.  BP Readings from Last 3 Encounters:  04/05/20 (!) 160/100  02/16/20 139/89  11/24/19 130/68   HLD -- no current medications Vit D deficiency -- does not take regular vitamin D supplement.  Health Maintenance: Immunizations -- UTD, declines Flu Colonoscopy -- due, will order Mammogram -- UTD PAP -- UTD, due 07/2022 Bone Density -- N/A Diet -- healthy diet; drinks mostly water, occasional juices Sleep habits -- no concerns [place]  Weight -- Weight: 168 lb 6.1 oz (76.4 kg)  Mood -- no concerns Weight history: Wt Readings from Last 10 Encounters:  04/05/20 168 lb 6.1 oz (76.4 kg)  11/24/19 164 lb (74.4 kg)  11/03/19 166 lb (75.3 kg)  10/26/19 166 lb (75.3 kg)  10/15/19 167 lb 1 oz (75.8 kg)  10/15/19 165 lb (74.8 kg)  10/08/19 168 lb (76.2 kg)  08/11/19 170 lb (77.1 kg)    07/27/19 170 lb (77.1 kg)  06/11/19 168 lb (76.2 kg)   Body mass index is 28.02 kg/m. Patient's last menstrual period was 09/28/2019. Period characteristics: none Alcohol use: none Tobacco use: none  Depression screen PHQ 2/9 04/05/2020  Decreased Interest 0  Down, Depressed, Hopeless 0  PHQ - 2 Score 0     Other providers/specialists: Patient Care Team: Inda Coke, [place] as PCP - General (Physician Assistant) Salvadore Dom, MD as Consulting Physician (Obstetrics and Gynecology)    PMHx, SurgHx, SocialHx, Medications, and Allergies were reviewed in the Visit Navigator and updated as appropriate.   Past Medical History:  Diagnosis Date  . Abnormal Pap smear of cervix 12/11   LGSIL  . Anemia   . Hyperlipidemia   . Menometrorrhagia 10/27/2018  . Vaginal delivery 1998, 2003     Past Surgical History:  Procedure Laterality Date  . COLPOSCOPY  1/12   mild dysplasia CIN1, HPV effect  . CYSTOSCOPY N/A 10/26/2019   Procedure: CYSTOSCOPY;  Surgeon: Salvadore Dom, MD;  Location: [facility];  Service: Gynecology;  Laterality: N/A;  . DILATION AND CURETTAGE OF UTERUS    . TOTAL LAPAROSCOPIC HYSTERECTOMY WITH SALPINGECTOMY N/A 10/26/2019   Procedure: TOTAL LAPAROSCOPIC HYSTERECTOMY WITH SALPINGECTOMY;  Surgeon: Salvadore Dom, MD;  Location: [facility];  Service: Gynecology;  Laterality: N/A;  to follow first case     Family History  Problem Relation Age of Onset  . Thyroid disease  Mother   . Hypertension Mother   . Diabetes Mother   . Heart attack Mother   . Sickle cell anemia Sister   . Deep vein thrombosis Sister   . Cancer Neg Hx     Social History   Tobacco Use  . Smoking status: Never Smoker  . Smokeless tobacco: Never Used  Vaping Use  . Vaping Use: Never used  Substance Use Topics  . Alcohol use: No  . Drug use: No    Review of Systems:   Review of Systems  Constitutional: Negative for chills,  fever, malaise/fatigue and weight loss.  HENT: Negative for hearing loss, sinus pain and sore throat.   Respiratory: Negative for cough and hemoptysis.   Cardiovascular: Negative for chest pain, palpitations, leg swelling and PND.  Gastrointestinal: Negative for abdominal pain, constipation, diarrhea, heartburn, nausea and vomiting.  Genitourinary: Negative for dysuria, frequency and urgency.  Musculoskeletal: Positive for joint pain. Negative for back pain, myalgias and neck pain.  Skin: Negative for itching and rash.  Neurological: Negative for dizziness, tingling, seizures and headaches.  Endo/Heme/Allergies: Negative for polydipsia.  Psychiatric/Behavioral: Negative for depression. The patient is not nervous/anxious.     Objective:   BP (!) 160/100 (BP Location: Left Arm, Cuff Size: Normal)   Pulse 77   Temp 98.3 F (36.8 C) (Temporal)   Ht 5\' 5"  (1.651 m)   Wt 168 lb 6.1 oz (76.4 kg)   LMP 09/28/2019   SpO2 98%   BMI 28.02 kg/m  Body mass index is 28.02 kg/m.   General Appearance:    Alert, cooperative, no distress, appears stated age  Head:    Normocephalic, without obvious abnormality, atraumatic  Eyes:    PERRL, conjunctiva/corneas clear, EOM's intact, fundi    benign, both eyes  Ears:    Normal TM's and external ear canals, both ears  Nose:   Nares normal, septum midline, mucosa normal, no drainage    or sinus tenderness  Throat:   Lips, mucosa, and tongue normal; teeth and gums normal  Neck:   Supple, symmetrical, trachea midline, no adenopathy;    thyroid:  no enlargement/tenderness/nodules; no carotid   bruit or JVD  Back:     Symmetric, no curvature, ROM normal, no CVA tenderness  Lungs:     Clear to auscultation bilaterally, respirations unlabored  Chest Wall:    No tenderness or deformity   Heart:    Regular rate and rhythm, S1 and S2 normal, no murmur, rub or gallop  Breast Exam:    Deferred  Abdomen:     Soft, non-tender, bowel sounds active all four  quadrants,    no masses, no organomegaly  Genitalia:    Deferred  Extremities:   Extremities normal, atraumatic, no cyanosis or edema L distal anterior shin with palpable tender nodule  Pulses:   2+ and symmetric all extremities  Skin:   Skin color, texture, turgor normal, no rashes or lesions  Lymph nodes:   Cervical, supraclavicular, and axillary nodes normal  Neurologic:   CNII-XII intact, normal strength, sensation and reflexes    throughout    Assessment/Plan:   Janice Thomas was seen today for annual exam.  Diagnoses and all orders for this visit:  Encounter for general adult medical examination with abnormal findings Today patient counseled on age appropriate routine health concerns for screening and prevention, each reviewed and up to date or declined. Immunizations reviewed and up to date or declined. Labs ordered and reviewed. Risk factors for depression reviewed  and negative. Hearing function and visual acuity are intact. ADLs screened and addressed as needed. Functional ability and level of safety reviewed and appropriate. Education, counseling and referrals performed based on assessed risks today. Patient provided with a copy of personalized plan for preventive services.  Hyperlipidemia, unspecified hyperlipidemia type Update lipid panel today and provide recommendations accordingly. -     Lipid panel; Future -     Lipid panel  Bilateral hand pain No visible abnormalities. Suspect possible OA. Will trial topical voltaren gel. If sx do not improve or worsen, will refer to sports medicine.  Overweight Encouraged healthy diet and exercise as able. -     CBC with Differential/Platelet; Future -     Comprehensive metabolic panel; Future -     Comprehensive metabolic panel -     CBC with Differential/Platelet  Vitamin D deficiency Update Vit D lab and provide recommendations on supplementation accordingly. -     VITAMIN D 25 Hydroxy (Vit-D Deficiency, Fractures); Future -      VITAMIN D 25 Hydroxy (Vit-D Deficiency, Fractures)  Screening for HIV (human immunodeficiency virus) -     HIV Antibody (routine testing w rflx); Future -     HIV Antibody (routine testing w rflx)  Pain in left shin Will order u/s for further evaluation. -     [place] LT LOWER EXTREM LTD SOFT TISSUE NON VASCULAR; Future  Special screening for malignant neoplasms, colon -     Ambulatory referral to Gastroenterology  Elevated blood pressure reading Asymptomatic. Recommend close monitoring at home. If >140/90 consistently, recommend return to office, or if develop symptoms.    Well Adult Exam: Labs ordered: Yes. Patient counseling was done. See below for items discussed. Discussed the patient's BMI.  The BMI is not in the acceptable range; BMI management plan is completed Follow up in 3 months. Breast cancer screening: UTD. Cervical cancer screening: n/a   Patient Counseling: [x]    Nutrition: Stressed importance of moderation in sodium/caffeine intake, saturated fat and cholesterol, caloric balance, sufficient intake of fresh fruits, vegetables, fiber, calcium, iron, and 1 mg of folate supplement per day (for females capable of pregnancy).  [x]    Stressed the importance of regular exercise.   [x]    Substance Abuse: Discussed cessation/primary prevention of tobacco, alcohol, or other drug use; driving or other dangerous activities under the influence; availability of treatment for abuse.   [x]    Injury prevention: Discussed safety belts, safety helmets, smoke detector, smoking near bedding or upholstery.   [x]    Sexuality: Discussed sexually transmitted diseases, partner selection, use of condoms, avoidance of unintended pregnancy  and contraceptive alternatives.  [x]    Dental health: Discussed importance of regular tooth brushing, flossing, and dental visits.  [x]    Health maintenance and immunizations reviewed. Please refer to Health maintenance section.   CMA or LPN served as scribe during this  visit. History, Physical, and Plan performed by medical provider. The above documentation has been reviewed and is accurate and complete.   Inda Coke, PA-C [place]

## 2020-04-05 NOTE — Patient Instructions (Addendum)
It was great to see you!  You will be contacted about your ultrasound for your leg and your colonoscopy.  Please check your blood pressure for [place] if you can, our goal is for it to be <140/90. If you get numbers consistently in this range, please call us. If you cannot check your blood pressure, please return to the office in 1-3 months for office visit to recheck this, sooner if concerns.  For your hands: Voltaren Gel is available over the counter.   You are to apply this gel to your injured body part twice daily (morning and evening).   A little goes a long way so you can use about a pea-sized amount for each area.  ? Spread this small amount over the area into a thin film and let it dry.  ? Be sure that you do not rub the gel into your skin for more than 10 or 15 seconds otherwise it can irritate you skin.   ? Once you apply the gel, please do not put any other lotion or clothing in contact with that area for 30 minutes to allow the gel to absorb into your skin.   Some people are sensitive to the medication and can develop a sunburn-like rash.  If you have only mild symptoms it is okay to continue to use the medication but if you have any breakdown of your skin you should discontinue its use and please let us know.      Please go to the lab for blood work.   Our office will call you with your results unless you have chosen to receive results via MyChart.  If your blood work is normal we will follow-up each year for physicals and as scheduled for chronic medical problems.  If anything is abnormal we will treat accordingly and get you in for a follow-up.  Take care,  [facility] Maintenance, Female Adopting a healthy lifestyle and getting preventive care are important in promoting health and wellness. Ask your health care provider about:  The right schedule for you to have regular tests and exams.  Things you can do on your own to prevent diseases and keep yourself  healthy. What should I know about diet, weight, and exercise? Eat a healthy diet   Eat a diet that includes plenty of vegetables, fruits, low-fat dairy products, and lean protein.  Do not eat a lot of foods that are high in solid fats, added sugars, or sodium. Maintain a healthy weight Body mass index (BMI) is used to identify weight problems. It estimates body fat based on height and weight. Your health care provider can help determine your BMI and help you achieve or maintain a healthy weight. Get regular exercise Get regular exercise. This is one of the most important things you can do for your health. Most adults should:  Exercise for at least 150 minutes each week. The exercise should increase your heart rate and make you sweat (moderate-intensity exercise).  Do strengthening exercises at least twice a week. This is in addition to the moderate-intensity exercise.  Spend less time sitting. Even light physical activity can be beneficial. Watch cholesterol and blood lipids Have your blood tested for lipids and cholesterol at 45 years of age, then have this test every 5 years. Have your cholesterol levels checked more often if:  Your lipid or cholesterol levels are high.  You are older than 45 years of age.  You are at high risk for heart  disease. What should I know about cancer screening? Depending on your health history and family history, you may need to have cancer screening at various ages. This may include screening for:  Breast cancer.  Cervical cancer.  Colorectal cancer.  Skin cancer.  Lung cancer. What should I know about heart disease, diabetes, and high blood pressure? Blood pressure and heart disease  High blood pressure causes heart disease and increases the risk of stroke. This is more likely to develop in people who have high blood pressure readings, are of African descent, or are overweight.  Have your blood pressure checked: ? Every 3-5 years if you are  73-4 years of age. ? Every year if you are 38 years old or older. Diabetes Have regular diabetes screenings. This checks your fasting blood sugar level. Have the screening done:  Once every three years after age 14 if you are at a normal weight and have a low risk for diabetes.  More often and at a younger age if you are overweight or have a high risk for diabetes. What should I know about preventing infection? Hepatitis B If you have a higher risk for hepatitis B, you should be screened for this virus. Talk with your health care provider to find out if you are at risk for hepatitis B infection. Hepatitis C Testing is recommended for:  Everyone born from [birth year] through 1966.  Anyone with known risk factors for hepatitis C. Sexually transmitted infections (STIs)  Get screened for STIs, including gonorrhea and chlamydia, if: ? You are sexually active and are younger than 45 years of age. ? You are older than 45 years of age and your health care provider tells you that you are at risk for this type of infection. ? Your sexual activity has changed since you were last screened, and you are at increased risk for chlamydia or gonorrhea. Ask your health care provider if you are at risk.  Ask your health care provider about whether you are at high risk for HIV. Your health care provider may recommend a prescription medicine to help prevent HIV infection. If you choose to take medicine to prevent HIV, you should first get tested for HIV. You should then be tested every 3 months for as long as you are taking the medicine. Pregnancy  If you are about to stop having your period (premenopausal) and you may become pregnant, seek counseling before you get pregnant.  Take 400 to 800 micrograms (mcg) of folic acid every day if you become pregnant.  Ask for birth control (contraception) if you want to prevent pregnancy. Osteoporosis and menopause Osteoporosis is a disease in which the bones lose  minerals and strength with aging. This can result in bone fractures. If you are 28 years old or older, or if you are at risk for osteoporosis and fractures, ask your health care provider if you should:  Be screened for bone loss.  Take a calcium or vitamin D supplement to lower your risk of fractures.  Be given hormone replacement therapy (HRT) to treat symptoms of menopause. Follow these instructions at home: Lifestyle  Do not use any products that contain nicotine or tobacco, such as cigarettes, e-cigarettes, and chewing tobacco. If you need help quitting, ask your health care provider.  Do not use street drugs.  Do not share needles.  Ask your health care provider for help if you need support or information about quitting drugs. Alcohol use  Do not drink alcohol if: ? Your health  care provider tells you not to drink. ? You are pregnant, may be pregnant, or are planning to become pregnant.  If you drink alcohol: ? Limit how much you use to 0-1 drink a day. ? Limit intake if you are breastfeeding.  Be aware of how much alcohol is in your drink. In the [place] one drink equals one 12 oz bottle of beer (355 mL), one 5 oz glass of wine (148 mL), or one 1 oz glass of hard liquor (44 mL). General instructions  Schedule regular health, dental, and eye exams.  Stay current with your vaccines.  Tell your health care provider if: ? You often feel depressed. ? You have ever been abused or do not feel safe at home. Summary  Adopting a healthy lifestyle and getting preventive care are important in promoting health and wellness.  Follow your health care provider's instructions about healthy diet, exercising, and getting tested or screened for diseases.  Follow your health care provider's instructions on monitoring your cholesterol and blood pressure. This information is not intended to replace advice given to you by your health care provider. Make sure you discuss any questions you  have with your health care provider. Document Revised: 04/23/2018 Document Reviewed: 04/23/2018 Elsevier Patient Education  2020 [employer].

## 2020-04-06 LAB — CBC WITH DIFFERENTIAL/PLATELET
Absolute Monocytes: 441 cells/uL (ref 200–950)
Basophils Absolute: 28 cells/uL (ref 0–200)
Basophils Relative: 0.4 %
Eosinophils Absolute: 98 cells/uL (ref 15–500)
Eosinophils Relative: 1.4 %
HCT: 38.5 % (ref 35.0–45.0)
Hemoglobin: 13.1 g/dL (ref 11.7–15.5)
Lymphs Abs: 2044 cells/uL (ref 850–3900)
MCH: 29.9 pg (ref 27.0–33.0)
MCHC: 34 g/dL (ref 32.0–36.0)
MCV: 87.9 fL (ref 80.0–100.0)
MPV: 10.3 fL (ref 7.5–12.5)
Monocytes Relative: 6.3 %
Neutro Abs: 4389 cells/uL (ref 1500–7800)
Neutrophils Relative %: 62.7 %
Platelets: 324 10*3/uL (ref 140–400)
RBC: 4.38 10*6/uL (ref 3.80–5.10)
RDW: 13 % (ref 11.0–15.0)
Total Lymphocyte: 29.2 %
WBC: 7 10*3/uL (ref 3.8–10.8)

## 2020-04-06 LAB — COMPREHENSIVE METABOLIC PANEL
AG Ratio: 1.2 (calc) (ref 1.0–2.5)
ALT: 14 U/L (ref 6–29)
AST: 14 U/L (ref 10–35)
Albumin: 4.2 g/dL (ref 3.6–5.1)
Alkaline phosphatase (APISO): 36 U/L (ref 31–125)
BUN: 10 mg/dL (ref 7–25)
CO2: 28 mmol/L (ref 20–32)
Calcium: 10.1 mg/dL (ref 8.6–10.2)
Chloride: 99 mmol/L (ref 98–110)
Creat: 0.75 mg/dL (ref 0.50–1.10)
Globulin: 3.5 g/dL (calc) (ref 1.9–3.7)
Glucose, Bld: 84 mg/dL (ref 65–99)
Potassium: 4.5 mmol/L (ref 3.5–5.3)
Sodium: 135 mmol/L (ref 135–146)
Total Bilirubin: 1.3 mg/dL — ABNORMAL HIGH (ref 0.2–1.2)
Total Protein: 7.7 g/dL (ref 6.1–8.1)

## 2020-04-06 LAB — LIPID PANEL
Cholesterol: 235 mg/dL — ABNORMAL HIGH (ref <200)
HDL: 42 mg/dL — ABNORMAL LOW (ref >=50)
LDL Cholesterol (Calc): 164 mg/dL (calc) — ABNORMAL HIGH
Non-HDL Cholesterol (Calc): 193 mg/dL (calc) — ABNORMAL HIGH (ref <130)
Total CHOL/HDL Ratio: 5.6 (calc) — ABNORMAL HIGH (ref <5.0)
Triglycerides: 148 mg/dL (ref <150)

## 2020-04-06 LAB — VITAMIN D 25 HYDROXY (VIT D DEFICIENCY, FRACTURES): Vit D, 25-Hydroxy: 22 ng/mL — ABNORMAL LOW (ref 30–100)

## 2020-04-06 LAB — HIV ANTIBODY (ROUTINE TESTING W REFLEX): HIV 1&2 Ab, 4th Generation: NONREACTIVE

## 2020-04-12 ENCOUNTER — Encounter: Payer: Self-pay | Admitting: Gastroenterology

## 2020-04-12 ENCOUNTER — Telehealth: Payer: Self-pay

## 2020-04-12 NOTE — Telephone Encounter (Signed)
DOS 04/13/2020  [place] BUNIONECTOMY LT - 44695 VS/ SILVER BUNIONECTOMY LT [id number]  [employer] EFFECTIVE DATE - 06/12/201  PLAN DEDUCTIBLE - $2800.00 W/ $0.00 REMAINING OUT OF POCKET - $2500.00 W/ $0.00 REMAINING COPAY $0.00 COINSURANCE - 80%  DR PRICE CALLED [employer] FOR A PEER TO PEER REVIEW. WAS TOLD IF HE ENDED UP DOING CPT 75051 HE WILL NEED TO CALL BACK AND GET THAT CODE APPROVED.  NOTIFICATION/PRIOR AUTHORIZATION NUMBER CASE STATUS CASE STATUS REASON PRIMARY CARE PHYSICIAN [id number] Open Open Burnard Bunting ADVANCE NOTIFY DATE/TIME ADMISSION NOTIFY DATE/TIME 04/04/2020 09:41 AM CST - COVERAGE STATUS OVERALL COVERAGE STATUS Partially Approved 1-2 CODE DESCRIPTION COVERAGE STATUS DECISION DATE [facility] Coverage determination is reflected for the facility admission and is not a guarantee of payment for ongoing services. Covered/Approved 04/12/2020 1 [id number] Correction, hallux valgus (bunionectomy) more Not Covered/Not Appr 04/11/2020 2 [id number] Correction, hallux valgus (bunionectomy) more Covered/Approved 04/12/2020

## 2020-04-13 ENCOUNTER — Other Ambulatory Visit: Payer: Self-pay | Admitting: Podiatry

## 2020-04-13 DIAGNOSIS — M2012 Hallux valgus (acquired), left foot: Secondary | ICD-10-CM | POA: Diagnosis not present

## 2020-04-13 HISTORY — PX: FOOT SURGERY: SHX648

## 2020-04-13 MED ORDER — OXYCODONE-ACETAMINOPHEN 5-325 MG PO TABS
1.0000 | ORAL_TABLET | ORAL | 0 refills | Status: DC | PRN
Start: 2020-04-13 — End: 2020-05-19

## 2020-04-13 MED ORDER — CEPHALEXIN 500 MG PO CAPS: 500.0000 mg | ORAL_CAPSULE | Freq: Two times a day (BID) | ORAL | 0 refills | Status: DC

## 2020-04-13 MED ORDER — ONDANSETRON HCL 4 MG PO TABS: 4.0000 mg | ORAL_TABLET | Freq: Three times a day (TID) | ORAL | 0 refills | Status: DC | PRN

## 2020-04-13 NOTE — Progress Notes (Signed)
Rx sent to pharmacy for outpatient surgery. °

## 2020-04-14 ENCOUNTER — Telehealth: Payer: Self-pay | Admitting: Podiatry

## 2020-04-14 DIAGNOSIS — M79676 Pain in unspecified toe(s): Secondary | ICD-10-CM

## 2020-04-14 NOTE — Telephone Encounter (Signed)
Called and spoke to patient for post-op check. Doing well, denied issues. Pain tolerable with medications

## 2020-04-19 ENCOUNTER — Other Ambulatory Visit: Payer: Self-pay

## 2020-04-19 ENCOUNTER — Ambulatory Visit (INDEPENDENT_AMBULATORY_CARE_PROVIDER_SITE_OTHER): Payer: 59 | Admitting: Podiatry

## 2020-04-19 ENCOUNTER — Ambulatory Visit (INDEPENDENT_AMBULATORY_CARE_PROVIDER_SITE_OTHER): Payer: 59

## 2020-04-19 DIAGNOSIS — M2011 Hallux valgus (acquired), right foot: Secondary | ICD-10-CM

## 2020-04-19 DIAGNOSIS — M2012 Hallux valgus (acquired), left foot: Secondary | ICD-10-CM

## 2020-04-19 NOTE — Progress Notes (Signed)
  Subjective:  Patient ID: Janice Thomas, female    DOB: 11/26/1975,  MRN: [id number]  Chief Complaint  Patient presents with  . Routine Post Op    POV#1 DOS 12.1.2021 LT FOOT CORRECTION OF BUNION. Pt states no concerns. Denies fever/nausea/vomiting/chills.    DOS: 04/13/20 Procedure: Minimally invasive silver bunionectomy left  44 y.o. female presents with the above complaint. History confirmed with patient. Doing well pain controlled no post-op complaints.  Objective:  Physical Exam: tenderness at the surgical site, local edema noted and calf supple, nontender. Incision: healing well, no significant drainage, no dehiscence, no significant erythema    Radiographs: X-ray of the left foot: consistent with post-op state, reduction of 1st metatarsal   Assessment:   1. Acquired hallux valgus of both feet     Plan:  Patient was evaluated and treated and all questions answered.  Post-operative State -XR reviewed with patient -Ok to start showering at this time. Advised they cannot soak. -Dressing applied consisting of band-aid -WBAT in Surgical shoe -Surgical shoe dispensed  Return in about 1 week (around 04/26/2020) for Post-Op (No XRs).

## 2020-04-20 ENCOUNTER — Ambulatory Visit
Admission: RE | Admit: 2020-04-20 | Discharge: 2020-04-20 | Disposition: A | Discharge status: Home / Self Care | Payer: 59 | Source: Ambulatory Visit | Attending: Physician Assistant | Admitting: Physician Assistant

## 2020-04-20 DIAGNOSIS — M79662 Pain in left lower leg: Secondary | ICD-10-CM

## 2020-04-26 ENCOUNTER — Ambulatory Visit (INDEPENDENT_AMBULATORY_CARE_PROVIDER_SITE_OTHER): Payer: 59 | Admitting: Podiatry

## 2020-04-26 ENCOUNTER — Other Ambulatory Visit: Payer: Self-pay

## 2020-04-26 DIAGNOSIS — M2041 Other hammer toe(s) (acquired), right foot: Secondary | ICD-10-CM

## 2020-04-26 DIAGNOSIS — M2012 Hallux valgus (acquired), left foot: Secondary | ICD-10-CM

## 2020-04-26 DIAGNOSIS — Z9889 Other specified postprocedural states: Secondary | ICD-10-CM

## 2020-04-26 DIAGNOSIS — M2011 Hallux valgus (acquired), right foot: Secondary | ICD-10-CM

## 2020-04-26 NOTE — Progress Notes (Signed)
  Subjective:  Patient ID: Janice Thomas, female    DOB: [birth year],  MRN: [id number]  Chief Complaint  Patient presents with  . Routine Post Op    Doing well only mild pain   DOS: 04/13/20 Procedure: Minimally invasive silver bunionectomy left  44 y.o. female presents with the above complaint. History confirmed with patient. Doing well pain controlled no post-op complaints.  Objective:  Physical Exam: mild tenderness at the surgical site, local edema noted and calf supple, nontender. Incision: healed  Assessment:   1. Acquired hallux valgus of both feet   2. Hammer toe of right foot   3. Post-operative state     Plan:  Patient was evaluated and treated and all questions answered.  Post-operative State -Sutures removed -Steri-strips applied to the incision -WBAT in Surgical shoe -Trnasition to normal shoegear as tolerated  Return in about 4 weeks (around 05/24/2020) for Post-Op (No XRs).

## 2020-04-28 ENCOUNTER — Ambulatory Visit (AMBULATORY_SURGERY_CENTER): Payer: Self-pay

## 2020-04-28 ENCOUNTER — Other Ambulatory Visit: Payer: Self-pay

## 2020-04-28 VITALS — Ht 65.0 in | Wt 166.0 lb

## 2020-04-28 DIAGNOSIS — Z1211 Encounter for screening for malignant neoplasm of colon: Secondary | ICD-10-CM

## 2020-04-28 MED ORDER — PLENVU 140 G PO SOLR: 1.0000 | ORAL | 0 refills | Status: DC

## 2020-04-28 NOTE — Progress Notes (Signed)
No allergies to soy or egg Pt is not on blood thinners or diet pills Denies issues with sedation/intubation Denies atrial flutter/fib Denies constipation   Emmi instructions given to pt  Pt is aware of Covid safety and care partner requirements.  

## 2020-04-29 ENCOUNTER — Encounter: Payer: Self-pay | Admitting: Gastroenterology

## 2020-05-10 ENCOUNTER — Encounter: Payer: 59 | Admitting: Podiatry

## 2020-05-19 ENCOUNTER — Encounter: Payer: Self-pay | Admitting: Gastroenterology

## 2020-05-19 ENCOUNTER — Ambulatory Visit (AMBULATORY_SURGERY_CENTER): Payer: 59 | Admitting: Gastroenterology

## 2020-05-19 ENCOUNTER — Other Ambulatory Visit: Payer: Self-pay

## 2020-05-19 VITALS — BP 140/88 | HR 78 | Temp 97.9°F | Resp 17 | Ht 65.0 in | Wt 166.0 lb

## 2020-05-19 DIAGNOSIS — Z1211 Encounter for screening for malignant neoplasm of colon: Secondary | ICD-10-CM

## 2020-05-19 DIAGNOSIS — K635 Polyp of colon: Secondary | ICD-10-CM | POA: Diagnosis not present

## 2020-05-19 DIAGNOSIS — D122 Benign neoplasm of ascending colon: Secondary | ICD-10-CM

## 2020-05-19 MED ORDER — SODIUM CHLORIDE 0.9 % IV SOLN: 500.0000 mL | Freq: Once | INTRAVENOUS | Status: DC

## 2020-05-19 NOTE — Progress Notes (Signed)
Called to room to assist during endoscopic procedure.  Patient ID and intended procedure confirmed with present staff. Received instructions for my participation in the procedure from the performing physician.  

## 2020-05-19 NOTE — Progress Notes (Signed)
No problems noted in the recovery room. maw 

## 2020-05-19 NOTE — Patient Instructions (Signed)
Handouts were given to you on polyps and hemorrhoids. You may resume your current medications today. Await biopsy results.  May take 2-3 weeks to receive pathology results. Please call if any questions or concerns.    YOU HAD AN ENDOSCOPIC PROCEDURE TODAY AT THE [facility]:   Refer to the procedure report that was given to you for any specific questions about what was found during the examination.  If the procedure report does not answer your questions, please call your gastroenterologist to clarify.  If you requested that your care partner not be given the details of your procedure findings, then the procedure report has been included in a sealed envelope for you to review at your convenience later.  YOU SHOULD EXPECT: Some feelings of bloating in the abdomen. Passage of more gas than usual.  Walking can help get rid of the air that was put into your GI tract during the procedure and reduce the bloating. If you had a lower endoscopy (such as a colonoscopy or flexible sigmoidoscopy) you may notice spotting of blood in your stool or on the toilet paper. If you underwent a bowel prep for your procedure, you may not have a normal bowel movement for a few days.  Please Note:  You might notice some irritation and congestion in your nose or some drainage.  This is from the oxygen used during your procedure.  There is no need for concern and it should clear up in a day or so.  SYMPTOMS TO REPORT IMMEDIATELY:   Following lower endoscopy (colonoscopy or flexible sigmoidoscopy):  Excessive amounts of blood in the stool  Significant tenderness or worsening of abdominal pains  Swelling of the abdomen that is new, acute  Fever of 100F or higher   For urgent or emergent issues, a gastroenterologist can be reached at any hour by calling [phone]. Do not use MyChart messaging for urgent concerns.    DIET:  We do recommend a small meal at first, but then you may proceed to your regular  diet.  Drink plenty of fluids but you should avoid alcoholic beverages for 24 hours.  ACTIVITY:  You should plan to take it easy for the rest of today and you should NOT DRIVE or use heavy machinery until tomorrow (because of the sedation medicines used during the test).    FOLLOW UP: Our staff will call the number listed on your records 48-72 hours following your procedure to check on you and address any questions or concerns that you may have regarding the information given to you following your procedure. If we do not reach you, we will leave a message.  We will attempt to reach you two times.  During this call, we will ask if you have developed any symptoms of COVID 19. If you develop any symptoms (ie: fever, flu-like symptoms, shortness of breath, cough etc.) before then, please call [phone].  If you test positive for Covid 19 in the 2 weeks post procedure, please call and report this information to us.    If any biopsies were taken you will be contacted by phone or by letter within the next 1-3 weeks.  Please call us at [phone] if you have not heard about the biopsies in 3 weeks.    SIGNATURES/CONFIDENTIALITY: You and/or your care partner have signed paperwork which will be entered into your electronic medical record.  These signatures attest to the fact that that the information above on your After Visit Summary has been   and is understood.  Full responsibility of the confidentiality of this discharge information lies with you and/or your care-partner.

## 2020-05-19 NOTE — Op Note (Signed)
[facility] Patient Name: Janice Thomas Procedure Date: 05/19/2020 3:06 PM MRN: [id number] Endoscopist: Remo Lipps P. Havery Moros , MD Age: 45 Referring MD:  Date of Birth: [birth year] Gender: Female Account #: [id number] Procedure:                Colonoscopy Indications:              Screening for colorectal malignant neoplasm, This                            is the patient's first colonoscopy Medicines:                Monitored Anesthesia Care Procedure:                Pre-Anesthesia Assessment:                           - Prior to the procedure, a History and Physical                            was performed, and patient medications and                            allergies were reviewed. The patient's tolerance of                            previous anesthesia was also reviewed. The risks                            and benefits of the procedure and the sedation                            options and risks were discussed with the patient.                            All questions were answered, and informed consent                            was obtained. Prior Anticoagulants: The patient has                            taken no previous anticoagulant or antiplatelet                            agents. ASA Grade Assessment: II - A patient with                            mild systemic disease. After reviewing the risks                            and benefits, the patient was deemed in                            satisfactory condition to undergo the procedure.  After obtaining informed consent, the colonoscope                            was passed under direct vision. Throughout the                            procedure, the patient's blood pressure, pulse, and                            oxygen saturations were monitored continuously. The                            Olympus CF-HQ190L (Serial# [id number]) Colonoscope was                            introduced through  the anus and advanced to the the                            cecum, identified by appendiceal orifice and                            ileocecal valve. The colonoscopy was performed                            without difficulty. The patient tolerated the                            procedure well. The quality of the bowel                            preparation was good. The ileocecal valve,                            appendiceal orifice, and rectum were photographed. Scope In: 3:17:38 PM Scope Out: 3:36:59 PM Scope Withdrawal Time: 0 hours 16 minutes 14 seconds  Total Procedure Duration: 0 hours 19 minutes 21 seconds  Findings:                 The perianal and digital rectal examinations were                            normal.                           A diminutive polyp was found in the ascending                            colon. The polyp was sessile. The polyp was removed                            with a cold snare. Resection and retrieval were                            complete.  Internal hemorrhoids were found during                            retroflexion, along with small hypertrophied anal                            papillae. The hemorrhoids were small.                           The exam was otherwise without abnormality. Complications:            No immediate complications. Estimated blood loss:                            Minimal. Estimated Blood Loss:     Estimated blood loss was minimal. Impression:               - One diminutive polyp in the ascending colon,                            removed with a cold snare. Resected and retrieved.                           - Internal hemorrhoids with small hypertrophied                            anal papillae.                           - The examination was otherwise normal. Recommendation:           - Patient has a contact number available for                            emergencies. The signs and symptoms of  potential                            delayed complications were discussed with the                            patient. Return to normal activities tomorrow.                            Written discharge instructions were provided to the                            patient.                           - Resume previous diet.                           - Continue present medications.                           - Await pathology results. Viviann Spare P. Shaneece Stockburger, MD 05/19/2020 3:41:01 PM This report has been signed electronically.

## 2020-05-19 NOTE — Progress Notes (Signed)
Pt's states no medical or surgical changes since previsit or office visit. 

## 2020-05-23 ENCOUNTER — Telehealth: Payer: Self-pay | Admitting: *Deleted

## 2020-05-23 NOTE — Telephone Encounter (Signed)
  Follow up Call-  Call back number 05/19/2020  Post procedure Call Back phone  # [phone]  Permission to leave phone message Yes  Some recent data might be hidden     Patient questions:  Do you have a fever, pain , or abdominal swelling? No. Pain Score  0 *  Have you tolerated food without any problems? Yes.    Have you been able to return to your normal activities? Yes.    Do you have any questions about your discharge instructions: Diet   No. Medications  No. Follow up visit  No.  Do you have questions or concerns about your Care? No.  Actions: * If pain score is 4 or above: No action needed, pain <4.  1. Have you developed a fever since your procedure? no  2.   Have you had an respiratory symptoms (SOB or cough) since your procedure? no  3.   Have you tested positive for COVID 19 since your procedure no  4.   Have you had any family members/close contacts diagnosed with the COVID 19 since your procedure?  no   If yes to any of these questions please route to Joylene John, RN and Joella Prince, RN

## 2020-05-27 ENCOUNTER — Other Ambulatory Visit: Payer: Self-pay

## 2020-05-27 ENCOUNTER — Ambulatory Visit (INDEPENDENT_AMBULATORY_CARE_PROVIDER_SITE_OTHER): Payer: 59 | Admitting: Podiatry

## 2020-05-27 ENCOUNTER — Encounter: Payer: Self-pay | Admitting: Podiatry

## 2020-05-27 DIAGNOSIS — M2011 Hallux valgus (acquired), right foot: Secondary | ICD-10-CM

## 2020-05-27 DIAGNOSIS — M2012 Hallux valgus (acquired), left foot: Secondary | ICD-10-CM

## 2020-05-27 NOTE — Progress Notes (Signed)
  Subjective:  Patient ID: Janice Thomas, female    DOB: 1975/11/29,  MRN: [id number]  Chief Complaint  Patient presents with  . Routine Post Op    POV#3 DOS 12.1.2021 Pt states gradual improvement with some residual pain.   DOS: 04/13/20 Procedure: Minimally invasive silver bunionectomy left  45 y.o. female presents with the above complaint. History confirmed with patient.  Doing well only some pain with certain shoe gear.  Unsure if she will be able to wear her normal work shoes at this time. Objective:  Physical Exam: mild tenderness at the surgical site, local edema noted and calf supple, nontender. Incision: healed  Assessment:   1. Acquired hallux valgus of both feet     Plan:  Patient was evaluated and treated and all questions answered.  Post-operative State -Dispensed silicone bunion shield.  I advised her to continue working on her normal shoe gear and wear shoes that she was working preparation for return she is to return as discussed she has been instructed to further discuss extending her out of work.  Follow-up in 6 weeks for recheck  No follow-ups on file.

## 2020-06-26 IMAGING — DX DG CHEST 2V
2 series · 2 of 2 positions shown · non-contrast
Comparison: None.

CLINICAL DATA: Chest wall pain

EXAM:
CHEST - 2 VIEW

[chest pa]
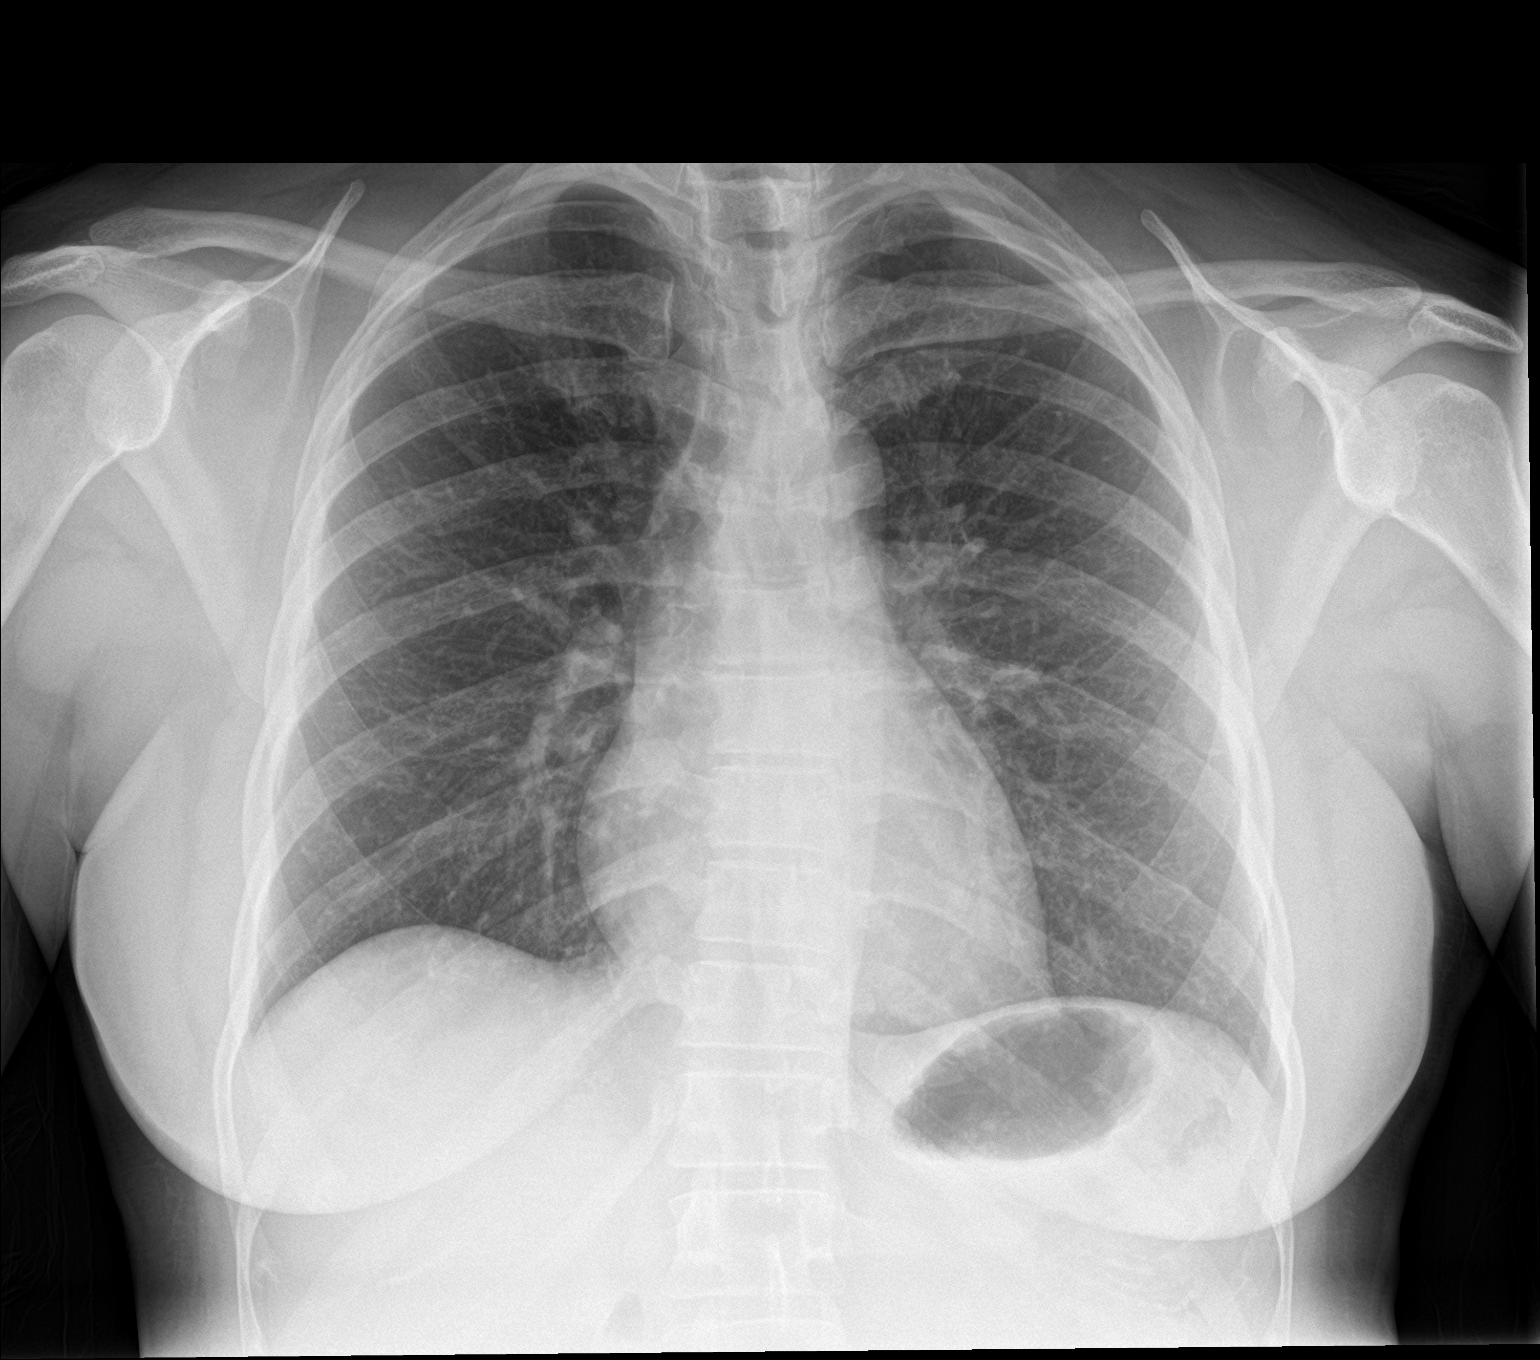

[chest lat]
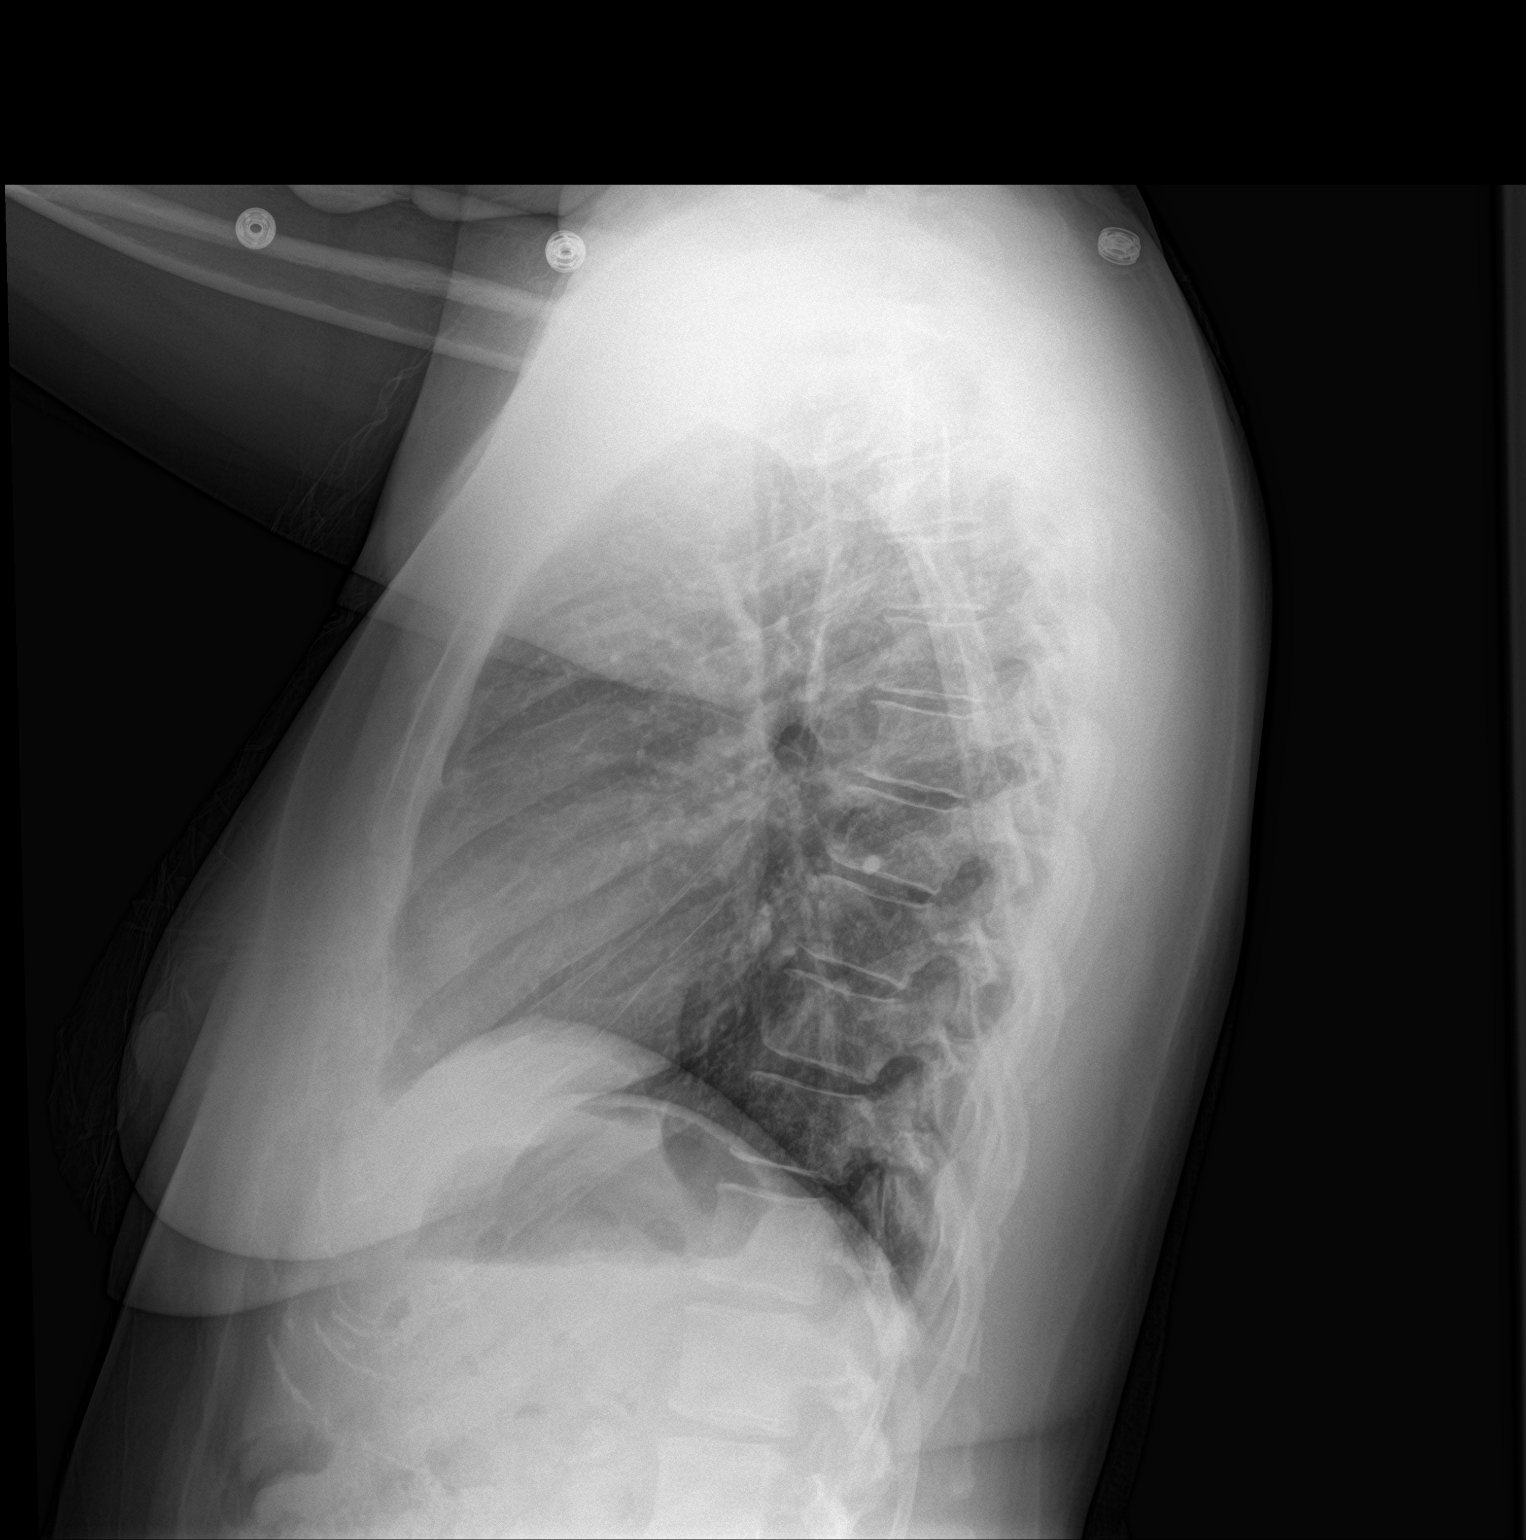

[2 of 2 positions shown; findings below may reference images not displayed]

FINDINGS: The heart size and mediastinal contours are within normal limits.
Both lungs are clear. The visualized skeletal structures are
unremarkable.
IMPRESSION: No active cardiopulmonary disease.

## 2020-06-27 ENCOUNTER — Other Ambulatory Visit: Payer: Self-pay

## 2020-06-27 ENCOUNTER — Encounter: Payer: Self-pay | Admitting: Physician Assistant

## 2020-06-27 ENCOUNTER — Ambulatory Visit (INDEPENDENT_AMBULATORY_CARE_PROVIDER_SITE_OTHER): Payer: 59 | Admitting: Physician Assistant

## 2020-06-27 VITALS — BP 142/90 | HR 91 | Temp 97.3°F | Ht 65.0 in | Wt 165.2 lb

## 2020-06-27 DIAGNOSIS — M25473 Effusion, unspecified ankle: Secondary | ICD-10-CM

## 2020-06-27 DIAGNOSIS — M7989 Other specified soft tissue disorders: Secondary | ICD-10-CM | POA: Diagnosis not present

## 2020-06-27 DIAGNOSIS — M79641 Pain in right hand: Secondary | ICD-10-CM

## 2020-06-27 DIAGNOSIS — M79642 Pain in left hand: Secondary | ICD-10-CM | POA: Diagnosis not present

## 2020-06-27 MED ORDER — FUROSEMIDE 20 MG PO TABS: 20.0000 mg | ORAL_TABLET | Freq: Every day | ORAL | 1 refills | Status: DC

## 2020-06-27 NOTE — Progress Notes (Signed)
Janice Thomas is a 45 y.o. female here for a new problem.  I acted as a Education administrator for [employer], PA-C Anselmo Pickler, LPN   History of Present Illness:   Chief Complaint  Patient presents with  . Edema    HPI  Hand swelling and pain Pt c/o swelling in wrists, knuckles in hands x 3 weeks. She recently returned to work after a a 2 month leave for bunion surgery, so she is back to gripping and typing which she does a lot of with her job. Denies any personal hx of arthritis or family hx of RA. Has tried tylenol here are there with some relief.  Bilateral ankle swelling Has occurred since returning to work recently. Denies calf pain, SOB, lightheadedness, palpitations. Blood pressure at home yesterday was 136/80. Doesn't eat much salt. Has tried to use compression hose with some relief of symptoms.   Past Medical History:  Diagnosis Date  . Abnormal Pap smear of cervix 12/11   LGSIL  . Anemia   . Hyperlipidemia   . Menometrorrhagia 10/27/2018  . Vaginal delivery 1998, 2003     Social History   Tobacco Use  . Smoking status: Never Smoker  . Smokeless tobacco: Never Used  Vaping Use  . Vaping Use: Never used  Substance Use Topics  . Alcohol use: No  . Drug use: No    Past Surgical History:  Procedure Laterality Date  . COLPOSCOPY  1/12   mild dysplasia CIN1, HPV effect  . CYSTOSCOPY N/A 10/26/2019   Procedure: CYSTOSCOPY;  Surgeon: Salvadore Dom, MD;  Location: [facility];  Service: Gynecology;  Laterality: N/A;  . DILATION AND CURETTAGE OF UTERUS    . FOOT SURGERY    . TOTAL LAPAROSCOPIC HYSTERECTOMY WITH SALPINGECTOMY N/A 10/26/2019   Procedure: TOTAL LAPAROSCOPIC HYSTERECTOMY WITH SALPINGECTOMY;  Surgeon: Salvadore Dom, MD;  Location[facility];  Service: Gynecology;  Laterality: N/A;  to follow first case    Family History  Problem Relation Age of Onset  . Thyroid disease Mother   . Hypertension Mother   .  Diabetes Mother   . Heart attack Mother   . Sickle cell anemia Sister   . Deep vein thrombosis Sister   . Cancer Neg Hx   . Colon cancer Neg Hx   . Colon polyps Neg Hx   . Esophageal cancer Neg Hx   . Stomach cancer Neg Hx   . Rectal cancer Neg Hx     No Known Allergies  Current Medications:   Current Outpatient Medications:  .  acetaminophen (TYLENOL) 500 MG tablet, Take 2 tablets (1,000 mg total) by mouth every 6 (six) hours., Disp: 30 tablet, Rfl: 0 .  furosemide (LASIX) 20 MG tablet, Take 1 tablet (20 mg total) by mouth daily., Disp: 30 tablet, Rfl: 1 .  Multiple Vitamin (MULTIVITAMIN PO), Take by mouth., Disp: , Rfl:  .  VITAMIN D PO, Take by mouth., Disp: , Rfl:    Review of Systems:   ROS  Negative unless otherwise specified per HPI.  Vitals:   Vitals:   06/27/20 1413  BP: (!) 142/90  Pulse: 91  Temp: (!) 97.3 F (36.3 C)  TempSrc: Temporal  SpO2: 96%  Weight: 165 lb 4 oz (75 kg)  Height: 5\' 5"  (1.651 m)     Body mass index is 27.5 kg/m.  Physical Exam:   Physical Exam Vitals and nursing note reviewed.  Constitutional:  General: She is not in acute distress.    Appearance: She is well-developed. She is not ill-appearing, toxic-appearing or sickly-appearing.  Cardiovascular:     Rate and Rhythm: Normal rate and regular rhythm.     Pulses: Normal pulses.     Heart sounds: Normal heart sounds, S1 normal and S2 normal.     Comments: 1+ bilateral trace ankle swelling Pulmonary:     Effort: Pulmonary effort is normal.     Breath sounds: Normal breath sounds.  Musculoskeletal:     Comments: Grip strength decreased in bilateral hands 2/2 pain Normal ROM in bilateral hands Slight swelling noted at 3rd MCP joint bilaterally  Skin:    General: Skin is warm, dry and intact.  Neurological:     Mental Status: She is alert.     GCS: GCS eye subscore is 4. GCS verbal subscore is 5. GCS motor subscore is 6.  Psychiatric:        Mood and Affect: Mood and  affect normal.        Speech: Speech normal.        Behavior: Behavior normal. Behavior is cooperative.     Assessment and Plan:   Lezly was seen today for edema.  Diagnoses and all orders for this visit:  Swelling of both hands; Pain in both hands Suspect possible carpal tunnel and/or early arthritis. Recommend conservative management with splinting, ice, topical voltaren, and may take oral ibuprofen as needed. If no improvement, will refer to sports medicine.  Ankle swelling, unspecified laterality No red flags on exam. Suspect dependent edema. Labs have been done in the past 3 months. Reviewed today. Trial lasix 20 mg prn and continue compression hose. Follow-up if no improvement. Provided BP parameters <130/80 and weight gain parameters. If new symptoms develop such as SOB, chest pain, issues with breathing, she was instructed to let us know.  Other orders -     furosemide (LASIX) 20 MG tablet; Take 1 tablet (20 mg total) by mouth daily.   CMA or LPN served as scribe during this visit. History, Physical, and Plan performed by medical provider. The above documentation has been reviewed and is accurate and complete.  Inda Coke, PA-C

## 2020-06-27 NOTE — Patient Instructions (Signed)
It was great to see you!  Treating you for two things today: Dependent edema Use lasix 20 mg as needed for ankle swelling. If no improvement or worsening, let me know. Call us if you have NEW 1) 3 pound weight gain in 24 hours or 5 pounds in 1 week 2) shortness of breath, with or without a dry hacking cough 3) swelling in the hands, feet or stomach 4) if you have to sleep on extra pillows at night in order to breathe.   BP goal for you is 130/80. If consistently higher than this, come see me.  Carpal tunnel in your hands, possible early arthritis. Trial splints, ice, topical voltaren gel and oral ibuprofen as needed. If no improvement, let me know and we will refer you to our sports medicine doctor.  Voltaren Gel is available over the counter. You are to apply this gel to your injured body part twice daily (morning and evening).   A little goes a long way so you can use about a pea-sized amount for each area.  ? Spread this small amount over the area into a thin film and let it dry.  ? Be sure that you do not rub the gel into your skin for more than 10 or 15 seconds otherwise it can irritate you skin.   ? Once you apply the gel, please do not put any other lotion or clothing in contact with that area for 30 minutes to allow the gel to absorb into your skin.   Some people are sensitive to the medication and can develop a sunburn-like rash.  If you have only mild symptoms it is okay to continue to use the medication but if you have any breakdown of your skin you should discontinue its use and please let us know.

## 2020-07-15 ENCOUNTER — Other Ambulatory Visit: Payer: Self-pay

## 2020-07-15 ENCOUNTER — Ambulatory Visit (INDEPENDENT_AMBULATORY_CARE_PROVIDER_SITE_OTHER): Payer: 59 | Admitting: Podiatry

## 2020-07-15 ENCOUNTER — Other Ambulatory Visit: Payer: Self-pay | Admitting: Podiatry

## 2020-07-15 ENCOUNTER — Ambulatory Visit (INDEPENDENT_AMBULATORY_CARE_PROVIDER_SITE_OTHER): Payer: 59

## 2020-07-15 DIAGNOSIS — M2011 Hallux valgus (acquired), right foot: Secondary | ICD-10-CM | POA: Diagnosis not present

## 2020-07-15 DIAGNOSIS — M2012 Hallux valgus (acquired), left foot: Secondary | ICD-10-CM | POA: Diagnosis not present

## 2020-07-15 DIAGNOSIS — M7661 Achilles tendinitis, right leg: Secondary | ICD-10-CM

## 2020-07-15 DIAGNOSIS — M79671 Pain in right foot: Secondary | ICD-10-CM

## 2020-07-15 MED ORDER — MELOXICAM 15 MG PO TABS: 15.0000 mg | ORAL_TABLET | Freq: Every day | ORAL | 0 refills | Status: DC

## 2020-07-15 NOTE — Patient Instructions (Signed)

## 2020-07-15 NOTE — Progress Notes (Signed)
  Subjective:  Patient ID: Janice Thomas, female    DOB: April 13, 1976,  MRN: [id number]  Chief Complaint  Patient presents with  . Routine Post Op    POV#4 -pt denies N/V/F?Ch -pt states," back to work a little tender, but I do pretty good  after working 8 hrs." - w/ swelling; 5/10 soreness Tx: icing, and epsom salt   . Pain    Rt back heel pain x 2 wks; 7/10 sharp constant pain no injury /swelling -worse with walking Tx; stretching    DOS: 04/13/20 Procedure: Minimally invasive silver bunionectomy left  45 y.o. female presents with the above complaint. History confirmed with patient.  Objective:  Physical Exam: no tenderness at the surgical site, local edema noted and calf supple, nontender. Right posterior Achilles tendon pain on palpation Incision: healed  Assessment:   1. Achilles tendinitis of right lower extremity   2. Acquired hallux valgus of both feet     Plan:  Patient was evaluated and treated and all questions answered.  Post-operative State -Doing well post-op. Good ROM, no pain, the tenderness should resolve with time.  R Achilles tendonitis -Educated on stretching and icing of the affected limb. -Night splint dispensed. -Rx for Meloxicam. Advised on risks, benefits, and alternatives of the medication   No follow-ups on file.

## 2020-07-19 ENCOUNTER — Other Ambulatory Visit: Payer: Self-pay | Admitting: Physician Assistant

## 2020-08-02 ENCOUNTER — Other Ambulatory Visit: Payer: Self-pay | Admitting: Physician Assistant

## 2020-10-21 DIAGNOSIS — M069 Rheumatoid arthritis, unspecified: Secondary | ICD-10-CM | POA: Insufficient documentation

## 2021-08-09 ENCOUNTER — Encounter: Payer: Self-pay | Admitting: Obstetrics and Gynecology

## 2021-09-13 ENCOUNTER — Encounter: Payer: Self-pay | Admitting: Family

## 2021-09-13 NOTE — Progress Notes (Signed)
46 y.o. [id number] Single Black or African American Not Hispanic or Latino female here for annual exam.  H/O TLH/BS in 6/21.  ?She is having intermittent night sweats, no hot flashes during the day.  ?Sexually active, same long term partner (don't live together). No dyspareunia.   ? ?She was diagnosed with rheumatoid arthritis last year. Much better on medication.  ? ?Patient's last menstrual period was 09/28/2019.          ?Sexually active: Yes.    ?The current method of family planning is status post hysterectomy.    ?Exercising: Yes.     Walking  ?Smoker:  no ? ?Health Maintenance: ?Pap:  07/27/19 WNL Hr HPV Neg, 07-03-16 neg HPV HR neg ?History of abnormal Pap:  yes years ago per patient. F/U was normal.   ?MMG:  08/09/21 Bi-rads 1 Neg  ?BMD:   n/a ?Colonoscopy: 05/19/20 polyp f/u 10 years  ?TDaP:  06/02/2014  ?Gardasil: n/a ? ? reports that she has never smoked. She has never used smokeless tobacco. She reports that she does not drink alcohol and does not use drugs. 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 41 and 87 year old daughters. Grandchildren are 2 & 3.  ? ?Past Medical History:  ?Diagnosis Date  ? Abnormal Pap smear of cervix 12/11  ? LGSIL  ? Anemia   ? Hyperlipidemia   ? Menometrorrhagia 10/27/2018  ? Vaginal delivery 1998, 2003  ? ? ?Past Surgical History:  ?Procedure Laterality Date  ? COLPOSCOPY  05/2010  ? mild dysplasia CIN1, HPV effect  ? CYSTOSCOPY N/A 10/26/2019  ? Procedure: CYSTOSCOPY;  Surgeon: Salvadore Dom, MD;  Location: [facility];  Service: Gynecology;  Laterality: N/A;  ? DILATION AND CURETTAGE OF UTERUS    ? FOOT SURGERY  04/13/2020  ? TOTAL LAPAROSCOPIC HYSTERECTOMY WITH SALPINGECTOMY N/A 10/26/2019  ? Procedure: TOTAL LAPAROSCOPIC HYSTERECTOMY WITH SALPINGECTOMY;  Surgeon: Salvadore Dom, MD;  Location: [facility];  Service: Gynecology;  Laterality: N/A;  to follow first case  ? ? ?Current Outpatient Medications  ?Medication Sig Dispense Refill  ?  acetaminophen (TYLENOL) 500 MG tablet Take 2 tablets (1,000 mg total) by mouth every 6 (six) hours. 30 tablet 0  ? folic acid (FOLVITE) 1 MG tablet 1 tablet    ? methotrexate (RHEUMATREX) 2.5 MG tablet Take 20 mg by mouth once a week.    ? Multiple Vitamin (MULTIVITAMIN PO) Take by mouth.    ? NIFEdipine (ADALAT CC) 30 MG 24 hr tablet     ? VITAMIN D PO Take by mouth.    ? ?No current facility-administered medications for this visit.  ? ? ?Family History  ?Problem Relation Age of Onset  ? Thyroid disease Mother   ? Hypertension Mother   ? Diabetes Mother   ? Heart attack Mother   ? Sickle cell anemia Sister   ? Deep vein thrombosis Sister   ? Cancer Neg Hx   ? Colon cancer Neg Hx   ? Colon polyps Neg Hx   ? Esophageal cancer Neg Hx   ? Stomach cancer Neg Hx   ? Rectal cancer Neg Hx   ? ? ?Review of Systems  ?All other systems reviewed and are negative. ? ?Exam:   ?BP 130/80   Pulse 74   Ht '5\' 5"'$  (1.651 m)   Wt 170 lb (77.1 kg)   LMP 09/28/2019   SpO2 99%   BMI 28.29 kg/m?   Weight change: '@WEIGHTCHANGE'$ @ Height:  Height: '5\' 5"'$  (165.1 cm)  ?Ht Readings from Last 3 Encounters:  ?09/20/21 '5\' 5"'$  (1.651 m)  ?06/27/20 '5\' 5"'$  (1.651 m)  ?05/19/20 '5\' 5"'$  (1.651 m)  ? ? ?General appearance: alert, cooperative and appears stated age ?Head: Normocephalic, without obvious abnormality, atraumatic ?Neck: no adenopathy, supple, symmetrical, trachea midline and thyroid normal to inspection and palpation ?Lungs: clear to auscultation bilaterally ?Cardiovascular: regular rate and rhythm ?Breasts: normal appearance, no masses or tenderness ?Abdomen: soft, non-tender; non distended,  no masses,  no organomegaly ?Extremities: extremities normal, atraumatic, no cyanosis or edema ?Skin: Skin color, texture, turgor normal. No rashes or lesions ?Lymph nodes: Cervical, supraclavicular, and axillary nodes normal. ?No abnormal inguinal nodes palpated ?Neurologic: Grossly normal ? ? ?Pelvic: External genitalia:  no lesions ?              Urethra:  normal appearing urethra with no masses, tenderness or lesions ?             Bartholins and Skenes: normal    ?             Vagina: normal appearing vagina with normal color and discharge, no lesions ?             Cervix: absent ?              ?Bimanual Exam:  Uterus:  uterus absent ?             Adnexa: no mass, fullness, tenderness ?              Rectovaginal: Confirms ?              Anus:  normal sphincter tone, no lesions ? ?Gae Dry, CMA chaperoned for the exam. ? ?1. Well woman exam ?Discussed breast self exam ?Discussed calcium and vit D intake ?Mammogram and colonoscopy UTD ?Discussed menopausal symptoms, call if worsening ? ?2. Laboratory exam ordered as part of routine general medical examination ?- CBC ?- Comprehensive metabolic panel ?- Lipid panel ? ?3. Lipids abnormal ?- Lipid panel ? ?4. Elevated bilirubin ?- Comprehensive metabolic panel ? ?5. Vitamin D deficiency ?- VITAMIN D 25 Hydroxy (Vit-D Deficiency, Fractures) ? ? ? ?

## 2021-09-20 ENCOUNTER — Encounter: Payer: Self-pay | Admitting: Obstetrics and Gynecology

## 2021-09-20 ENCOUNTER — Ambulatory Visit (INDEPENDENT_AMBULATORY_CARE_PROVIDER_SITE_OTHER): Payer: Self-pay | Admitting: Obstetrics and Gynecology

## 2021-09-20 VITALS — BP 130/80 | HR 74 | Ht 65.0 in | Wt 170.0 lb

## 2021-09-20 DIAGNOSIS — Z0000 Encounter for general adult medical examination without abnormal findings: Secondary | ICD-10-CM

## 2021-09-20 DIAGNOSIS — Z01419 Encounter for gynecological examination (general) (routine) without abnormal findings: Secondary | ICD-10-CM

## 2021-09-20 DIAGNOSIS — Z79899 Other long term (current) drug therapy: Secondary | ICD-10-CM | POA: Insufficient documentation

## 2021-09-20 DIAGNOSIS — R17 Unspecified jaundice: Secondary | ICD-10-CM

## 2021-09-20 DIAGNOSIS — E559 Vitamin D deficiency, unspecified: Secondary | ICD-10-CM

## 2021-09-20 DIAGNOSIS — E7889 Other lipoprotein metabolism disorders: Secondary | ICD-10-CM

## 2021-09-20 NOTE — Patient Instructions (Signed)

## 2021-09-21 LAB — COMPREHENSIVE METABOLIC PANEL
AG Ratio: 1.3 (calc) (ref 1.0–2.5)
ALT: 15 U/L (ref 6–29)
AST: 15 U/L (ref 10–35)
Albumin: 4.4 g/dL (ref 3.6–5.1)
Alkaline phosphatase (APISO): 40 U/L (ref 31–125)
BUN: 12 mg/dL (ref 7–25)
CO2: 27 mmol/L (ref 20–32)
Calcium: 10.2 mg/dL (ref 8.6–10.2)
Chloride: 101 mmol/L (ref 98–110)
Creat: 0.82 mg/dL (ref 0.50–0.99)
Globulin: 3.3 g/dL (calc) (ref 1.9–3.7)
Glucose, Bld: 94 mg/dL (ref 65–99)
Potassium: 5.2 mmol/L (ref 3.5–5.3)
Sodium: 137 mmol/L (ref 135–146)
Total Bilirubin: 1.4 mg/dL — ABNORMAL HIGH (ref 0.2–1.2)
Total Protein: 7.7 g/dL (ref 6.1–8.1)

## 2021-09-21 LAB — CBC
HCT: 40.5 % (ref 35.0–45.0)
Hemoglobin: 13.7 g/dL (ref 11.7–15.5)
MCH: 30.9 pg (ref 27.0–33.0)
MCHC: 33.8 g/dL (ref 32.0–36.0)
MCV: 91.2 fL (ref 80.0–100.0)
MPV: 10.6 fL (ref 7.5–12.5)
Platelets: 336 10*3/uL (ref 140–400)
RBC: 4.44 10*6/uL (ref 3.80–5.10)
RDW: 12.5 % (ref 11.0–15.0)
WBC: 6.3 10*3/uL (ref 3.8–10.8)

## 2021-09-21 LAB — LIPID PANEL
Cholesterol: 230 mg/dL — ABNORMAL HIGH (ref <200)
HDL: 43 mg/dL — ABNORMAL LOW (ref >=50)
LDL Cholesterol (Calc): 162 mg/dL (calc) — ABNORMAL HIGH
Non-HDL Cholesterol (Calc): 187 mg/dL (calc) — ABNORMAL HIGH (ref <130)
Total CHOL/HDL Ratio: 5.3 (calc) — ABNORMAL HIGH (ref <5.0)
Triglycerides: 128 mg/dL (ref <150)

## 2021-09-21 LAB — VITAMIN D 25 HYDROXY (VIT D DEFICIENCY, FRACTURES): Vit D, 25-Hydroxy: 30 ng/mL (ref 30–100)

## 2021-09-22 ENCOUNTER — Encounter: Payer: Self-pay | Admitting: Family

## 2021-09-25 ENCOUNTER — Encounter: Payer: Self-pay | Admitting: Family

## 2021-09-27 ENCOUNTER — Encounter: Payer: Self-pay | Admitting: Physician Assistant

## 2021-10-02 ENCOUNTER — Encounter: Payer: Self-pay | Admitting: Physician Assistant

## 2021-10-02 ENCOUNTER — Ambulatory Visit (INDEPENDENT_AMBULATORY_CARE_PROVIDER_SITE_OTHER): Payer: Self-pay | Admitting: Physician Assistant

## 2021-10-02 VITALS — BP 112/70 | HR 72 | Temp 98.4°F | Ht 65.25 in | Wt 171.0 lb

## 2021-10-02 DIAGNOSIS — R17 Unspecified jaundice: Secondary | ICD-10-CM

## 2021-10-02 DIAGNOSIS — Z0000 Encounter for general adult medical examination without abnormal findings: Secondary | ICD-10-CM

## 2021-10-02 DIAGNOSIS — E785 Hyperlipidemia, unspecified: Secondary | ICD-10-CM

## 2021-10-02 DIAGNOSIS — R0789 Other chest pain: Secondary | ICD-10-CM

## 2021-10-02 DIAGNOSIS — M069 Rheumatoid arthritis, unspecified: Secondary | ICD-10-CM

## 2021-10-02 LAB — HEPATIC FUNCTION PANEL
ALT: 16 U/L (ref 0–35)
AST: 18 U/L (ref 0–37)
Albumin: 4.4 g/dL (ref 3.5–5.2)
Alkaline Phosphatase: 37 U/L — ABNORMAL LOW (ref 39–117)
Bilirubin, Direct: 0.2 mg/dL (ref 0.0–0.3)
Total Bilirubin: 1.5 mg/dL — ABNORMAL HIGH (ref 0.2–1.2)
Total Protein: 7.9 g/dL (ref 6.0–8.3)

## 2021-10-02 NOTE — Progress Notes (Signed)
Subjective:    Janice Thomas is a 46 y.o. female and is here for a comprehensive physical exam.  HPI  There are no preventive care reminders to display for this patient.  Acute Concerns: Elevated LDL  Patient reports she had blood work done about 1-2 weeks ago with OB/GYN. She has had found to have elevated LDL of 162 and she is concerned about this issue. She notes she has not been eating a lot of fried food and is not sure what could be causing this. She has been trying to exercise and follow healthy diet. Denies pain while walking.  Denies hx of smoking or alcohol.   Chest Pain Patient complain of chest pain that has onset for the past 1 week. She notes she has lifted about 40 bottles pack of water and has had chest discomfort since then. She think she might have strained the area. She has not tried any treatment to help alleviate her symptoms. She has been having some discomfort when lifting weights. Symptoms have been the same for the past week. States she has noticed some pain while sleeping, specifically when she is rolling over in the bed. Denies shortness or breath or abdominal pain. Denies pain that radiates down the leg. Denies dizziness nausea or vomiting. Denies any worsening symptoms.   Elevated bilirubin This was found on her prior blood work. Denies abdominal pain, excessive alcohol or tylenol.  Chronic Issues: Rheumatoid arthritis Patient is currently taking Rheumatrex 20 mg once a week. She is tolerating her medication without any side effects. She notes she has had flare up where she feel pain in her hand and left leg. Has had swelling sometimes. Her symptoms seems to be manageable at this time. Denies any worsening symptoms. Denies any hx of arthritis or family hx of RA.   Health Maintenance: Immunizations -- UTD Covid-UTD, 01/07/2020 Colonoscopy -- UTD Mammogram -- UTD, 08/09/2021 PAP -- n/a Bone Density -- N/A Diet -- Balanced Sleep habits -- unable to sleep  sometimes at night. Exercise -- has been exercising Weight -- 171(77.6 kg) Mood -- no concern Weight history: Wt Readings from Last 10 Encounters:  10/02/21 171 lb (77.6 kg)  09/20/21 170 lb (77.1 kg)  06/27/20 165 lb 4 oz (75 kg)  05/19/20 166 lb (75.3 kg)  04/28/20 166 lb (75.3 kg)  04/05/20 168 lb 6.1 oz (76.4 kg)  11/24/19 164 lb (74.4 kg)  11/03/19 166 lb (75.3 kg)  10/26/19 166 lb (75.3 kg)  10/15/19 167 lb 1 oz (75.8 kg)   Body mass index is 28.24 kg/m. Patient's last menstrual period was 09/28/2019. Alcohol use:  reports no history of alcohol use. Tobacco use: None  Tobacco Use: Low Risk    Smoking Tobacco Use: Never   Smokeless Tobacco Use: Never   Passive Exposure: Not on file        10/02/2021    9:00 AM  Depression screen PHQ 2/9  Decreased Interest 0  Down, Depressed, Hopeless 0  PHQ - 2 Score 0     Other providers/specialists: Patient Care Team: Inda Coke, [place] as PCP - General (Physician Assistant) Salvadore Dom, MD as Consulting Physician (Obstetrics and Gynecology)    PMHx, SurgHx, SocialHx, Medications, and Allergies were reviewed in the Visit Navigator and updated as appropriate.   Past Medical History:  Diagnosis Date   Abnormal Pap smear of cervix 04/2010   LGSIL   Anemia    Arthritis    Hyperlipidemia    Hypertension  Menometrorrhagia 10/27/2018   Vaginal delivery 1998, 2003     Past Surgical History:  Procedure Laterality Date   ABDOMINAL HYSTERECTOMY     COLPOSCOPY  05/2010   mild dysplasia CIN1, HPV effect   CYSTOSCOPY N/A 10/26/2019   Procedure: CYSTOSCOPY;  Surgeon: Salvadore Dom, MD;  Location: [facility];  Service: Gynecology;  Laterality: N/A;   DILATION AND CURETTAGE OF UTERUS     FOOT SURGERY  04/13/2020   TOTAL LAPAROSCOPIC HYSTERECTOMY WITH SALPINGECTOMY N/A 10/26/2019   Procedure: TOTAL LAPAROSCOPIC HYSTERECTOMY WITH SALPINGECTOMY;  Surgeon: Salvadore Dom, MD;  Location:  [facility];  Service: Gynecology;  Laterality: N/A;  to follow first case     Family History  Problem Relation Age of Onset   Thyroid disease Mother    Hypertension Mother    Diabetes Mother    Heart attack Mother    Arthritis Mother    Pulmonary embolism Father    Sickle cell anemia Sister    Deep vein thrombosis Sister    Cancer Neg Hx    Colon cancer Neg Hx    Colon polyps Neg Hx    Esophageal cancer Neg Hx    Stomach cancer Neg Hx    Rectal cancer Neg Hx     Social History   Tobacco Use   Smoking status: Never   Smokeless tobacco: Never  Vaping Use   Vaping Use: Never used  Substance Use Topics   Alcohol use: No   Drug use: No    Review of Systems:   Review of Systems  Constitutional:  Negative for chills, fever, malaise/fatigue and weight loss.  HENT:  Negative for hearing loss, sinus pain and sore throat.   Respiratory:  Negative for cough and hemoptysis.   Cardiovascular:  Negative for chest pain, palpitations, leg swelling and PND.  Gastrointestinal:  Negative for abdominal pain, constipation, diarrhea, heartburn, nausea and vomiting.  Genitourinary:  Negative for dysuria, frequency and urgency.  Musculoskeletal:  Negative for back pain, myalgias and neck pain.  Skin:  Negative for itching and rash.  Neurological:  Negative for dizziness, tingling, seizures and headaches.  Endo/Heme/Allergies:  Negative for polydipsia.  Psychiatric/Behavioral:  Negative for depression. The patient is not nervous/anxious.     Objective:   BP 112/70 (BP Location: Left Arm, Patient Position: Sitting, Cuff Size: Large)   Pulse 72   Temp 98.4 F (36.9 C) (Temporal)   Ht 5' 5.25" (1.657 m)   Wt 171 lb (77.6 kg)   LMP 09/28/2019   SpO2 97%   BMI 28.24 kg/m  Body mass index is 28.24 kg/m.   General Appearance:    Alert, cooperative, no distress, appears stated age  Head:    Normocephalic, without obvious abnormality, atraumatic  Eyes:    PERRL,  conjunctiva/corneas clear, EOM's intact, fundi    benign, both eyes  Ears:    Normal TM's and external ear canals, both ears  Nose:   Nares normal, septum midline, mucosa normal, no drainage    or sinus tenderness  Throat:   Lips, mucosa, and tongue normal; teeth and gums normal  Neck:   Supple, symmetrical, trachea midline, no adenopathy;    thyroid:  no enlargement/tenderness/nodules; no carotid   bruit or JVD  Back:     Symmetric, no curvature, ROM normal, no CVA tenderness  Lungs:     Clear to auscultation bilaterally, respirations unlabored  Chest Wall:    Tenderness to upper pectoral muscles b/l  Heart:    Regular rate and rhythm, S1 and S2 normal, no murmur, rub or gallop  Breast Exam:    Deferred  Abdomen:     Soft, non-tender, bowel sounds active all four quadrants,    no masses, no organomegaly  Genitalia:    Deferred  Extremities:   Extremities normal, atraumatic, no cyanosis or edema  Pulses:   2+ and symmetric all extremities  Skin:   Skin color, texture, turgor normal, no rashes or lesions  Lymph nodes:   Cervical, supraclavicular, and axillary nodes normal  Neurologic:   CNII-XII intact, normal strength, sensation and reflexes    throughout    Assessment/Plan:   Routine physical examination Today patient counseled on age appropriate routine health concerns for screening and prevention, each reviewed and up to date or declined. Immunizations reviewed and up to date or declined. Labs ordered and reviewed. Risk factors for depression reviewed and negative. Hearing function and visual acuity are intact. ADLs screened and addressed as needed. Functional ability and level of safety reviewed and appropriate. Education, counseling and referrals performed based on assessed risks today. Patient provided with a copy of personalized plan for preventive services.  Hyperlipidemia, unspecified hyperlipidemia type Reviewed ASCVD is 1.3% I do think she is a good candidate for calcium  score, I will order Follow-up based on this  Rheumatoid arthritis of other site, unspecified whether rheumatoid factor present ([place]) Well managed overall per patient Mgmt per specialist  Elevated bilirubin Update bilirubin and haptoglobin Make recommendations accordingly  Chest wall pain No red flags, no exertional component Suspect pec muscle strain Trial NSAIDs If new/worsening symptoms or lack of improvement, recommend close follow-up  Patient Counseling: '[x]'$    Nutrition: Stressed importance of moderation in sodium/caffeine intake, saturated fat and cholesterol, caloric balance, sufficient intake of fresh fruits, vegetables, fiber, calcium, iron, and 1 mg of folate supplement per day (for females capable of pregnancy).  '[x]'$    Stressed the importance of regular exercise.   '[x]'$    Substance Abuse: Discussed cessation/primary prevention of tobacco, alcohol, or other drug use; driving or other dangerous activities under the influence; availability of treatment for abuse.   '[x]'$    Injury prevention: Discussed safety belts, safety helmets, smoke detector, smoking near bedding or upholstery.   '[x]'$    Sexuality: Discussed sexually transmitted diseases, partner selection, use of condoms, avoidance of unintended pregnancy  and contraceptive alternatives.  '[x]'$    Dental health: Discussed importance of regular tooth brushing, flossing, and dental visits.  '[x]'$    Health maintenance and immunizations reviewed. Please refer to Health maintenance section.    I,Savera Zaman,acting as a Education administrator for [employer], PA.,have documented all relevant documentation on the behalf of Inda Coke, PA,as directed by  Inda Coke, PA while in the presence of Inda Coke, [place].   Inda Coke, PA-C [place]

## 2021-10-02 NOTE — Addendum Note (Signed)
Addended by: Erlene Quan on: 10/02/2021 09:42 AM   Modules accepted: Orders

## 2021-10-02 NOTE — Patient Instructions (Signed)
It was great to see you!  --We will update blood work today to follow-up on your bilirubin -- I will be in touch with these results and recommendations  -- Trial ibuprofen for your chest wall pain. If this doesn't help at all, let me know.  -- I will place order for the calcium score to be done (see handout). The cardiology office will contact you.  Please go to the lab for blood work.   Our office will call you with your results unless you have chosen to receive results via MyChart.  If your blood work is normal we will follow-up each year for physicals and as scheduled for chronic medical problems.  If anything is abnormal we will treat accordingly and get you in for a follow-up.  Take care,  Aldona Bar

## 2021-10-03 ENCOUNTER — Encounter: Payer: Self-pay | Admitting: Physician Assistant

## 2021-10-03 LAB — HAPTOGLOBIN: Haptoglobin: 116 mg/dL (ref 43–212)

## 2021-10-22 ENCOUNTER — Other Ambulatory Visit: Payer: Self-pay

## 2021-10-22 ENCOUNTER — Emergency Department (HOSPITAL_COMMUNITY): Payer: Self-pay

## 2021-10-22 ENCOUNTER — Encounter (HOSPITAL_COMMUNITY): Payer: Self-pay

## 2021-10-22 ENCOUNTER — Emergency Department (HOSPITAL_COMMUNITY)
Admission: EM | Admit: 2021-10-22 | Discharge: 2021-10-23 | Disposition: A | Discharge status: Home / Self Care | Payer: Self-pay | Attending: Emergency Medicine | Admitting: Emergency Medicine

## 2021-10-22 DIAGNOSIS — R079 Chest pain, unspecified: Secondary | ICD-10-CM

## 2021-10-22 DIAGNOSIS — R059 Cough, unspecified: Secondary | ICD-10-CM | POA: Insufficient documentation

## 2021-10-22 DIAGNOSIS — R0789 Other chest pain: Secondary | ICD-10-CM | POA: Insufficient documentation

## 2021-10-22 DIAGNOSIS — I10 Essential (primary) hypertension: Secondary | ICD-10-CM | POA: Insufficient documentation

## 2021-10-22 LAB — CBC
HCT: 41.8 % (ref 36.0–46.0)
Hemoglobin: 14.5 g/dL (ref 12.0–15.0)
MCH: 31.5 pg (ref 26.0–34.0)
MCHC: 34.7 g/dL (ref 30.0–36.0)
MCV: 90.9 fL (ref 80.0–100.0)
Platelets: 400 10*3/uL (ref 150–400)
RBC: 4.6 MIL/uL (ref 3.87–5.11)
RDW: 12.1 % (ref 11.5–15.5)
WBC: 5.9 10*3/uL (ref 4.0–10.5)
nRBC: 0 % (ref 0.0–0.2)

## 2021-10-22 NOTE — ED Triage Notes (Signed)
Pt reports testing positive for covid 3 days ago. Since then she has developed some generalized chest pains and pain when breathing.

## 2021-10-22 NOTE — ED Provider Triage Note (Signed)
  Emergency Medicine Provider Triage Evaluation Note  MRN:  [id number]  Arrival date & time: 10/22/21    Medically screening exam initiated at 10:23 PM.   CC:   Cough   HPI:  Janice Thomas is a 46 y.o. year-old female presents to the ED with chief complaint of chest pain.  Tested positive for COVID about 3 days ago.  Denies fever.  States most of the pain is from coughing.  Denies any successful treatments PTA.Marland Kitchen  History provided by History provided by patient. ROS:  -As included in HPI PE:   Vitals:   10/22/21 2215  BP: (!) 144/82  Pulse: 100  Resp: 18  Temp: 98.7 F (37.1 C)  SpO2: 99%    Non-toxic appearing No respiratory distress  MDM:  Based on signs and symptoms, COVID with associated muscle strain from cough is highest on my differential. I've ordered labs and imaging in triage to expedite lab/diagnostic workup.  Patient was informed that the remainder of the evaluation will be completed by another provider, this initial triage assessment does not replace that evaluation, and the importance of remaining in the ED until their evaluation is complete.    Montine Circle, PA-C 10/22/21 2224

## 2021-10-23 LAB — BASIC METABOLIC PANEL
Anion gap: 13 (ref 5–15)
BUN: 9 mg/dL (ref 6–20)
CO2: 24 mmol/L (ref 22–32)
Calcium: 9.6 mg/dL (ref 8.9–10.3)
Chloride: 98 mmol/L (ref 98–111)
Creatinine, Ser: 0.69 mg/dL (ref 0.44–1.00)
GFR, Estimated: >60 mL/min (ref >60)
Glucose, Bld: 109 mg/dL — ABNORMAL HIGH (ref 70–99)
Potassium: 3.7 mmol/L (ref 3.5–5.1)
Sodium: 135 mmol/L (ref 135–145)

## 2021-10-23 LAB — I-STAT BETA HCG BLOOD, ED (MC, WL, AP ONLY): I-stat hCG, quantitative: <5 m[IU]/mL (ref <5)

## 2021-10-23 LAB — TROPONIN I (HIGH SENSITIVITY)
Troponin I (High Sensitivity): 3 ng/L (ref <18)
Troponin I (High Sensitivity): 3 ng/L (ref <18)

## 2021-10-23 NOTE — ED Notes (Signed)
Pt verbalizes understanding of discharge instructions. Opportunity for questions and answers were provided. Pt discharged from the ED.   ?

## 2021-10-23 NOTE — ED Provider Notes (Signed)
[facility] EMERGENCY DEPARTMENT Provider Note   CSN: [id number] Arrival date & time: 10/22/21  2156     History  Chief Complaint  Patient presents with   Chest Pain    Janice Thomas is a 46 y.o. female.   Chest Pain Associated symptoms: cough   Associated symptoms: no abdominal pain, no back pain, no fever, no palpitations, no shortness of breath and no vomiting    46 year old female presents emergency department with complaints of chest pain.  Pain is described as sharp and left-sided.  It is exacerbated with taking a deep breath as well as coughing.  There is nonpositional in nature and not associated with physical exertion.  She recently tested positive for COVID this past Thursday after a known exposure at work the week prior.  Since her exposure, she has had symptoms of runny nose and sore throat up until a cough began yesterday.  Cough is described as dry in nature.  She has tried at home aspirin as well as over-the-counter cough and cold medicine with minimal improvement in symptoms. Denies fever, chills, night sweats, shortness of breath, abdominal pain, N/V/D, urinary/vaginal symptoms, change in bowel habits, leg swelling.  Denies history of diabetes, chf, asthma, COPD. Hx of HTN and HLD.  Past Medical History:  Diagnosis Date   Abnormal Pap smear of cervix 04/2010   LGSIL   Anemia    Arthritis    Hyperlipidemia    Hypertension    Menometrorrhagia 10/27/2018   Vaginal delivery 1998, 2003   Past Surgical History:  Procedure Laterality Date   ABDOMINAL HYSTERECTOMY     COLPOSCOPY  05/2010   mild dysplasia CIN1, HPV effect   CYSTOSCOPY N/A 10/26/2019   Procedure: CYSTOSCOPY;  Surgeon: Salvadore Dom, MD;  Location: [facility];  Service: Gynecology;  Laterality: N/A;   DILATION AND CURETTAGE OF UTERUS     FOOT SURGERY  04/13/2020   TOTAL LAPAROSCOPIC HYSTERECTOMY WITH SALPINGECTOMY N/A 10/26/2019   Procedure: TOTAL  LAPAROSCOPIC HYSTERECTOMY WITH SALPINGECTOMY;  Surgeon: Salvadore Dom, MD;  Location: [facility];  Service: Gynecology;  Laterality: N/A;  to follow first case   Home Medications Prior to Admission medications   Medication Sig Start Date End Date Taking? Authorizing Provider  acetaminophen (TYLENOL) 500 MG tablet Take 2 tablets (1,000 mg total) by mouth every 6 (six) hours. 10/26/19   Salvadore Dom, MD  folic acid (FOLVITE) 1 MG tablet 1 tablet 10/21/20   [provider]  methotrexate (RHEUMATREX) 2.5 MG tablet Take 20 mg by mouth once a week. 07/06/21   [provider]  Multiple Vitamin (MULTIVITAMIN PO) Take by mouth.    [provider]  NIFEdipine (ADALAT CC) 30 MG 24 hr tablet Take 30 mg by mouth daily. 09/30/20   [provider]  VITAMIN D PO Take 2,000 Units by mouth daily in the afternoon.    [provider]      Allergies    Patient has no known allergies.    Review of Systems   Review of Systems  Constitutional:  Negative for chills and fever.  HENT:  Negative for ear pain and sore throat.   Eyes:  Negative for pain and visual disturbance.  Respiratory:  Positive for cough. Negative for shortness of breath.   Cardiovascular:  Positive for chest pain. Negative for palpitations and leg swelling.  Gastrointestinal:  Negative for abdominal pain and vomiting.  Genitourinary:  Negative for dysuria and  hematuria.  Musculoskeletal:  Negative for arthralgias and back pain.  Skin:  Negative for color change and rash.  Neurological:  Negative for seizures and syncope.  All other systems reviewed and are negative.   Physical Exam Updated Vital Signs BP 140/81 (BP Location: Right Arm)   Pulse 84   Temp 97.7 F (36.5 C) (Oral)   Resp 15   Ht '5\' 5"'$  (1.651 m)   Wt 76.2 kg   LMP 09/28/2019   SpO2 100%   BMI 27.96 kg/m  Physical Exam Vitals and nursing note reviewed.  Constitutional:      General: She is not  in acute distress.    Appearance: She is well-developed.  HENT:     Head: Normocephalic and atraumatic.  Eyes:     Extraocular Movements: Extraocular movements intact.     Conjunctiva/sclera: Conjunctivae normal.  Cardiovascular:     Rate and Rhythm: Normal rate and regular rhythm.     Pulses:          Radial pulses are 2+ on the right side and 2+ on the left side.       Posterior tibial pulses are 2+ on the right side and 2+ on the left side.     Heart sounds: Normal heart sounds. No murmur heard.    No friction rub. No gallop.  Pulmonary:     Effort: Pulmonary effort is normal. No respiratory distress.     Breath sounds: Normal breath sounds. No decreased breath sounds, wheezing, rhonchi or rales.  Chest:     Comments: Tenderness to palpation over left chest wall where pain is described by patient.  No overlying skin abnormality noted. Abdominal:     Palpations: Abdomen is soft.     Tenderness: There is no abdominal tenderness.  Musculoskeletal:        General: No swelling. Normal range of motion.     Cervical back: Normal range of motion and neck supple.     Right lower leg: No tenderness. No edema.     Left lower leg: No tenderness. No edema.  Skin:    General: Skin is warm and dry.     Capillary Refill: Capillary refill takes less than 2 seconds.  Neurological:     General: No focal deficit present.     Mental Status: She is alert and oriented to person, place, and time.  Psychiatric:        Mood and Affect: Mood normal.     ED Results / Procedures / Treatments   Labs (all labs ordered are listed, but only abnormal results are displayed) Labs Reviewed  BASIC METABOLIC PANEL - Abnormal; Notable for the following components:      Result Value   Glucose, Bld 109 (*)    All other components within normal limits  CBC  I-STAT BETA HCG BLOOD, ED ([facility], [facility], [facility] ONLY)  TROPONIN I (HIGH SENSITIVITY)  TROPONIN I (HIGH SENSITIVITY)    EKG EKG  Interpretation  Date/Time:  Sunday October 22 2021 22:55:53 EDT Ventricular Rate:  95 PR Interval:  146 QRS Duration: 68 QT Interval:  354 QTC Calculation: 444 R Axis:   46 Text Interpretation: Normal sinus rhythm Cannot rule out Anterior infarct , age undetermined Abnormal ECG When compared with ECG of 13-Apr-2018 08:18, PREVIOUS ECG IS PRESENT Confirmed by Lavenia Atlas [phone]) on 10/23/2021 10:09:55 AM  Radiology DG Chest 2 View  Result Date: 10/22/2021 CLINICAL DATA:  Chest pain.  COVID positive. EXAM: CHEST - 2 VIEW  COMPARISON:  04/13/2018 FINDINGS: Heart and mediastinal contours are within normal limits. No focal opacities or effusions. No acute bony abnormality. IMPRESSION: No active cardiopulmonary disease. Electronically Signed   By: Rolm Baptise M.D.   On: 10/22/2021 22:54    Procedures Procedures    Medications Ordered in ED Medications - No data to display  ED Course/ Medical Decision Making/ A&P                           Medical Decision Making  This patient presents to the ED for concern of chest pain, this involves an extensive number of treatment options, and is a complaint that carries with it a high risk of complications and morbidity.  The differential diagnosis includes The emergent causes of chest pain include: Acute coronary syndrome, tamponade, pericarditis/myocarditis, aortic dissection, pulmonary embolism, tension pneumothorax, pneumonia, and esophageal rupture. I do not believe the patient has an emergent cause of chest pain, other urgent/non-acute considerations include, but are not limited to: chronic angina, aortic stenosis, cardiomyopathy, mitral valve prolapse, pulmonary hypertension, aortic insufficiency, right ventricular hypertrophy, pleuritis, bronchitis, pneumothorax, tumor, gastroesophageal reflux disease (GERD), esophageal spasm, Mallory-Weiss syndrome, peptic ulcer disease, pancreatitis, functional gastrointestinal pain, cervical or thoracic disk  disease or arthritis, shoulder arthritis, costochondritis, subacromial bursitis, anxiety or panic attack, herpes zoster, breast disorders, chest wall tumors, thoracic outlet syndrome, mediastinitis.  Co morbidities that complicate the patient evaluation  Hypertension, hyperlipidemia, rheumatoid arthritis,   Additional history obtained:  Additional history obtained from daughter who is at bedside External records from outside source obtained and reviewed including EKG from 12/19 indicating T wave inversion in leads aVF and III.  Gauging baseline for patient's current EKG   Lab Tests:  I Ordered, and personally interpreted labs.  The pertinent results include: No acute abnormalities   Imaging Studies ordered:  I ordered imaging studies including chest x-ray I independently visualized and interpreted imaging which showed no acute cardiopulmonary process I agree with the radiologist interpretation   Cardiac Monitoring: / EKG:  The patient was maintained on a cardiac monitor.  I personally viewed and interpreted the cardiac monitored which showed an underlying rhythm of: Sinus rhythm with T wave inversion 2 3 aVF   Consultations Obtained:  N/a   Problem List / ED Course / Critical interventions / Medication management  Chest pain Reevaluation of the patient showed that the patient improved I have reviewed the patients home medicines and have made adjustments as needed   Social Determinants of Health:  Denies tobacco, alcohol, illicit drug use    Test / Admission - Considered:  Chest pain Vitals signs significant for hypertension with blood pressure 140/81.  Recommend close follow-up with PCP for potential further blood pressure management. Otherwise within normal range and stable throughout visit. Laboratory/imaging studies significant for: No acute abnormalities Patient's symptoms most likely related to pleurisy or musculoskeletal pain given reproducibility on exam as  well as overall negative work-up.  Patient heart pathway score: 2-3 so 30-day Mace risk of 0.9% to 1.7%.  Wells PE score: 0.  PERC: Negative.  PE very unlikely given lack of clinical evidence as well as Wells and PERC negative.  Doubt dissection.  Doubt pericarditis due to lack of positional pain as well as EKG findings. Patient recommended symptomatic therapy with NSAIDs.   Worrisome signs and symptoms were discussed with the patient, and the patient acknowledged understanding to return to the ED if noticed. Patient was stable upon discharge.  Final Clinical Impression(s) / ED Diagnoses Final diagnoses:  Chest pain, unspecified type    Rx / DC Orders ED Discharge Orders     None         Wilnette Kales, [place] 10/23/21 1034    Lorelle Gibbs, DO 10/23/21 1555

## 2021-10-23 NOTE — Discharge Instructions (Addendum)
Symptomatic therapy encouraged with ibuprofen/Aleve for the next 5-7 days as directed.  Continue to take deep breaths as this decreases her likelihood of developing pneumonia.  As we discussed, if you begin to develop shortness of breath, if your chest pain becomes associated with physical activity, you notice increasing lower extremity swelling... This is worrisome for other causes of your chest pain that are more concerning.  Please do not hesitate to return to the emergency department if the worrisome signs and symptoms we discussed become apparent.

## 2021-11-02 ENCOUNTER — Other Ambulatory Visit: Payer: Self-pay | Admitting: Physician Assistant

## 2021-11-02 DIAGNOSIS — E785 Hyperlipidemia, unspecified: Secondary | ICD-10-CM

## 2021-11-03 ENCOUNTER — Ambulatory Visit (HOSPITAL_BASED_OUTPATIENT_CLINIC_OR_DEPARTMENT_OTHER)
Admission: RE | Admit: 2021-11-03 | Discharge: 2021-11-03 | Disposition: A | Discharge status: Home / Self Care | Payer: Medicaid Other | Source: Ambulatory Visit | Attending: Physician Assistant | Admitting: Physician Assistant

## 2021-11-03 DIAGNOSIS — E785 Hyperlipidemia, unspecified: Secondary | ICD-10-CM | POA: Insufficient documentation

## 2021-11-21 ENCOUNTER — Inpatient Hospital Stay: Admission: RE | Admit: 2021-11-21 | Payer: Medicaid Other | Source: Ambulatory Visit

## 2022-07-04 IMAGING — US US EXTREM LOW*L* LIMITED
1 series · 10 of 10 positions shown · non-contrast
Comparison: None.

CLINICAL DATA: Distal left shin swelling

EXAM:
ULTRASOUND left LOWER EXTREMITY LIMITED
TECHNIQUE: Ultrasound examination of the lower extremity soft tissues was
performed in the area of clinical concern.

[Series 1: us extrem low*left* limited · 0.04mm/px · 10 of 10 slices shown]
[im 1/10]
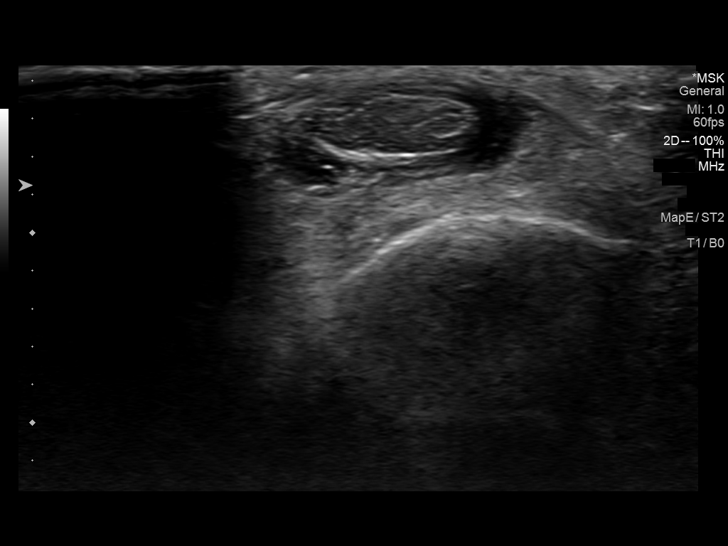
[im 2/10]
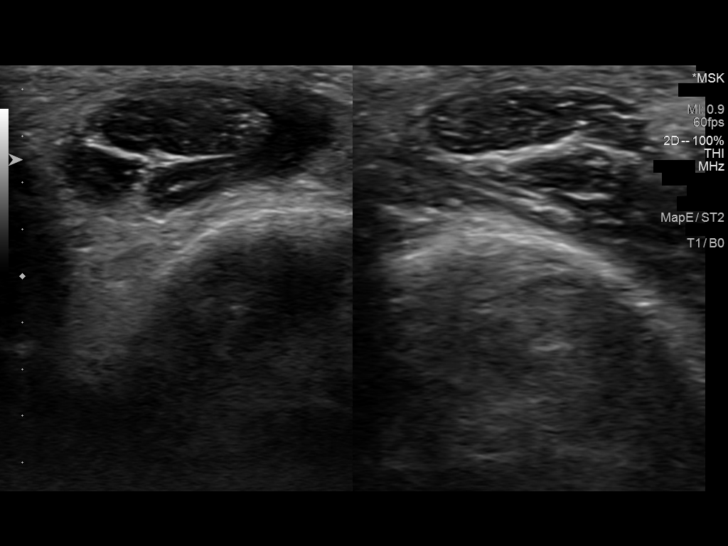
[im 3/10]
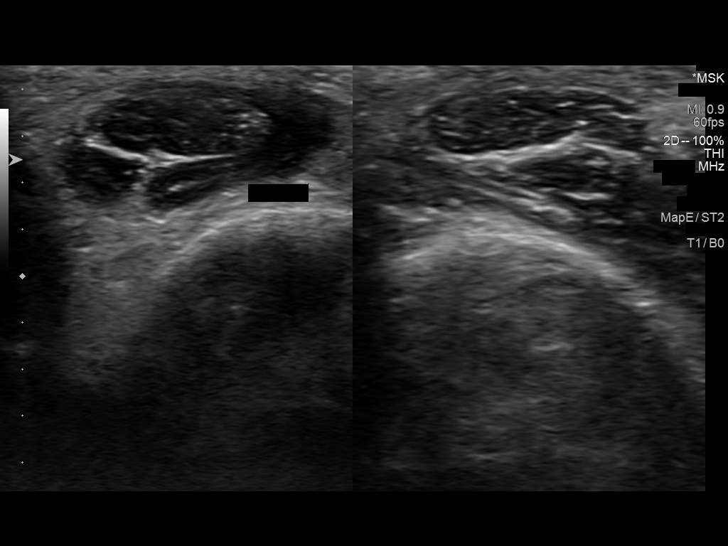
[im 4/10]
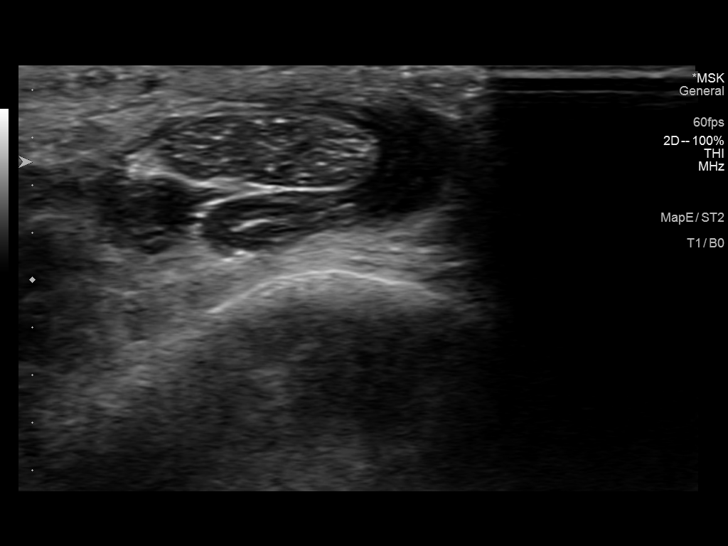
[im 5/10]
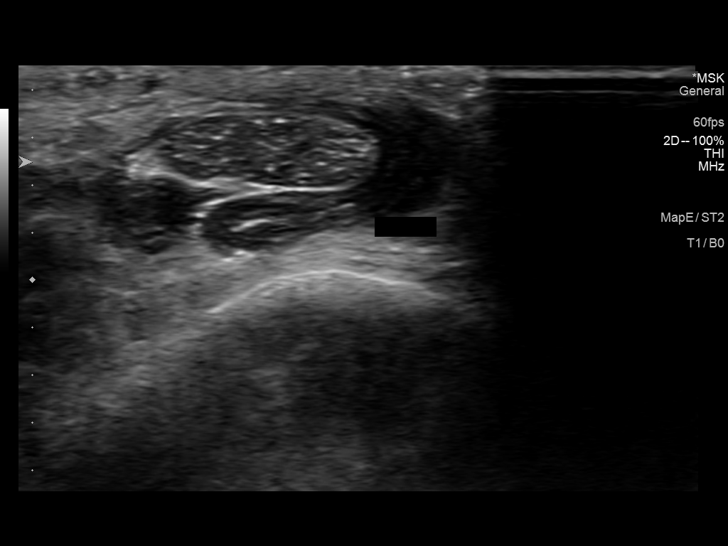
[im 6/10]
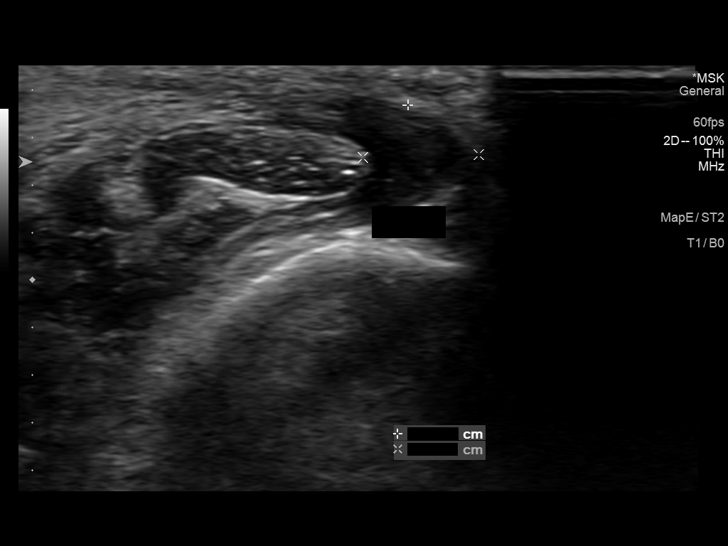
[im 7/10]
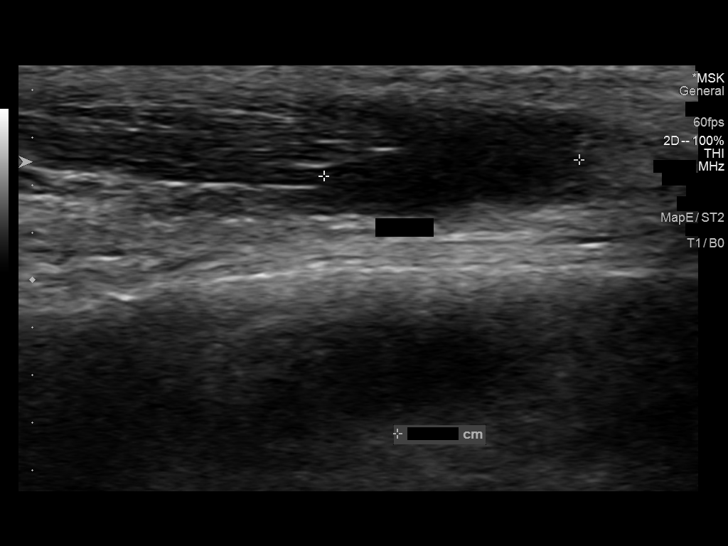
[im 8/10]
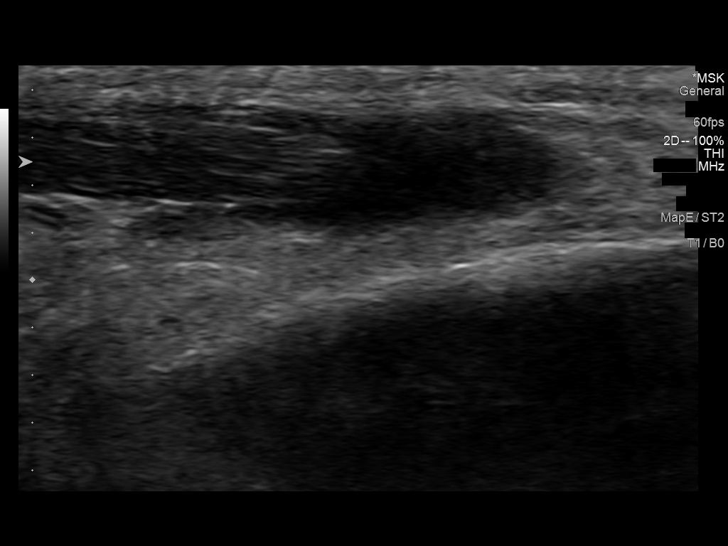
[im 9/10]
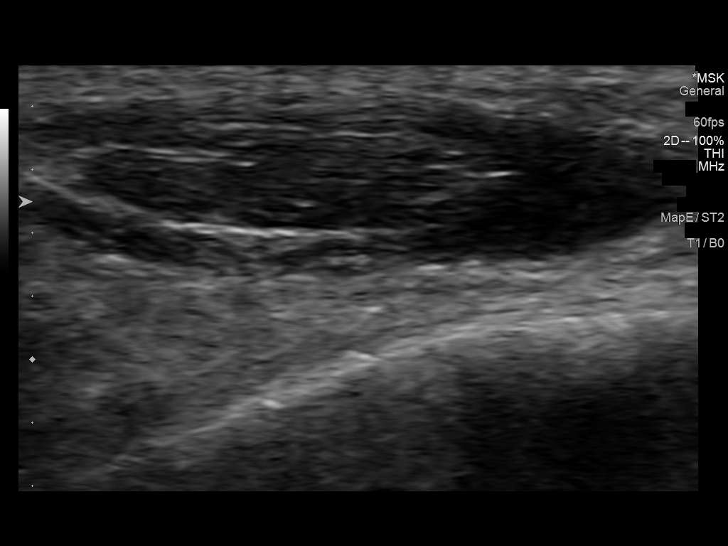
[im 10/10]
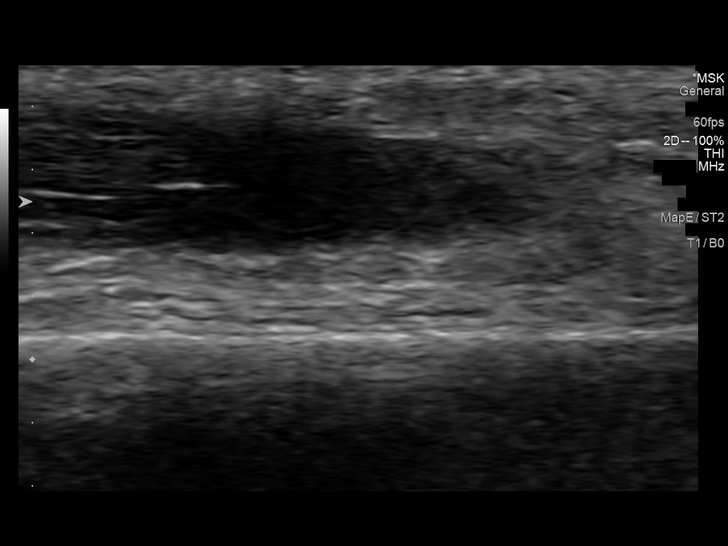

[10 of 10 positions shown; findings below may reference images not displayed]

FINDINGS: Joint Space: No effusion.

Muscles: Normal.

Tendons: There is fluid seen surrounding the tendon along the
anterior distal tibia which could be tear tibialis.

Other Soft Tissue Structures: Normal.
IMPRESSION: Loculated fluid seen surrounding the distal tendon, which could be a
anterior tibialis and the could be due to anterior tibialis
tenosynovitis

## 2022-08-16 ENCOUNTER — Encounter: Payer: Self-pay | Admitting: Obstetrics and Gynecology

## 2022-09-03 ENCOUNTER — Encounter: Payer: Self-pay | Admitting: Family

## 2022-10-23 ENCOUNTER — Encounter: Payer: Self-pay | Admitting: Family

## 2022-10-30 ENCOUNTER — Ambulatory Visit (INDEPENDENT_AMBULATORY_CARE_PROVIDER_SITE_OTHER): Payer: Commercial Managed Care - PPO | Admitting: Nurse Practitioner

## 2022-10-30 ENCOUNTER — Encounter: Payer: Self-pay | Admitting: Nurse Practitioner

## 2022-10-30 VITALS — BP 138/82 | HR 81 | Ht 65.5 in | Wt 171.0 lb

## 2022-10-30 DIAGNOSIS — Z01419 Encounter for gynecological examination (general) (routine) without abnormal findings: Secondary | ICD-10-CM

## 2022-10-30 NOTE — Progress Notes (Signed)
   Janice Thomas [birth year] [id number]   History:  47 y.o. [id number] presents for annual exam. S/P 2021 TAH for fibroids. Abnormal pap years ago, follow up normal per patient. HTN, HLD, RA.   Gynecologic History Patient's last menstrual period was 09/28/2019.   Contraception/Family planning: status post hysterectomy Sexually active: Yes  Health Maintenance Last Pap: 07/27/2019. Results were: Normal neg HPV Last mammogram: 08/15/2022. Results were: Normal Last colonoscopy: 05/19/2020. Results were: Benign polyp, 10-year recall Last Dexa: Not indicated  Past medical history, past surgical history, family history and social history were all reviewed and documented in the EPIC chart. Boyfriend. 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 27 yo daughter in Engineer, maintenance (IT) school at [employer]. 31 yo daughter, has 2 children ages 61 and 3.   ROS:  A ROS was performed and pertinent positives and negatives are included.  Exam:  Vitals:   10/30/22 0748  BP: 138/82  Pulse: 81  SpO2: 100%  Weight: 171 lb (77.6 kg)  Height: 5' 5.5" (1.664 m)   Body mass index is 28.02 kg/m.  General appearance:  Normal Thyroid:  Symmetrical, normal in size, without palpable masses or nodularity. Respiratory  Auscultation:  Clear without wheezing or rhonchi Cardiovascular  Auscultation:  Regular rate, without rubs, murmurs or gallops  Edema/varicosities:  Not grossly evident Abdominal  Soft,nontender, without masses, guarding or rebound.  Liver/spleen:  No organomegaly noted  Hernia:  None appreciated  Skin  Inspection:  Grossly normal Breasts: Examined lying and sitting.   Right: Without masses, retractions, nipple discharge or axillary adenopathy.   Left: Without masses, retractions, nipple discharge or axillary adenopathy. Genitourinary   Inguinal/mons:  Normal without inguinal adenopathy  External genitalia:  Normal appearing vulva with no masses, tenderness, or lesions  BUS/Urethra/Skene's glands:  Normal  Vagina:   Normal appearing with normal color and discharge, no lesions  Cervix:  And uterus absent  Adnexa/parametria:     Rt: Normal in size, without masses or tenderness.   Lt: Normal in size, without masses or tenderness.  Anus and perineum: Normal  Digital rectal exam: Deferred  Patient informed chaperone available to be present for breast and pelvic exam. Patient has requested no chaperone to be present. Patient has been advised what will be completed during breast and pelvic exam.   Assessment/Plan:  47 y.o. [id number] for annual exam.   Well female exam with routine gynecological exam - Education provided on SBEs, importance of preventative screenings, current guidelines, high calcium diet, regular exercise, and multivitamin daily.  Plans to have fasting labs with PCP.   Screening for cervical cancer - Normal Pap history. No longer screening per guidelines.   Screening for breast cancer - Normal mammogram history.  Continue annual screenings.  Normal breast exam today.  Screening for colon cancer - 2022 colonoscopy. Will repeat at 10-year interval per GI's recommendation.   Return in 1 year for annual.     Olivia Mackie DNP, 8:11 AM 10/30/2022

## 2023-08-21 LAB — HM MAMMOGRAPHY

## 2023-08-22 ENCOUNTER — Encounter: Payer: Self-pay | Admitting: Physician Assistant

## 2023-08-22 ENCOUNTER — Encounter: Payer: Self-pay | Admitting: Family

## 2023-08-23 ENCOUNTER — Encounter: Payer: Self-pay | Admitting: Family

## 2023-11-04 ENCOUNTER — Ambulatory Visit: Payer: Self-pay | Admitting: Physician Assistant

## 2023-11-04 ENCOUNTER — Ambulatory Visit (INDEPENDENT_AMBULATORY_CARE_PROVIDER_SITE_OTHER): Admitting: Physician Assistant

## 2023-11-04 ENCOUNTER — Encounter: Payer: Self-pay | Admitting: Physician Assistant

## 2023-11-04 VITALS — BP 130/88 | HR 71 | Temp 98.2°F | Ht 64.5 in | Wt 177.4 lb

## 2023-11-04 DIAGNOSIS — E559 Vitamin D deficiency, unspecified: Secondary | ICD-10-CM

## 2023-11-04 DIAGNOSIS — E663 Overweight: Secondary | ICD-10-CM

## 2023-11-04 DIAGNOSIS — E785 Hyperlipidemia, unspecified: Secondary | ICD-10-CM

## 2023-11-04 DIAGNOSIS — M0609 Rheumatoid arthritis without rheumatoid factor, multiple sites: Secondary | ICD-10-CM

## 2023-11-04 DIAGNOSIS — Z0000 Encounter for general adult medical examination without abnormal findings: Secondary | ICD-10-CM

## 2023-11-04 DIAGNOSIS — R7309 Other abnormal glucose: Secondary | ICD-10-CM | POA: Diagnosis not present

## 2023-11-04 LAB — CBC WITH DIFFERENTIAL/PLATELET
Basophils Absolute: 0 10*3/uL (ref 0.0–0.1)
Basophils Relative: 0.4 % (ref 0.0–3.0)
Eosinophils Absolute: 0 10*3/uL (ref 0.0–0.7)
Eosinophils Relative: 1 % (ref 0.0–5.0)
HCT: 40.3 % (ref 36.0–46.0)
Hemoglobin: 13.8 g/dL (ref 12.0–15.0)
Lymphocytes Relative: 38.9 % (ref 12.0–46.0)
Lymphs Abs: 1.9 10*3/uL (ref 0.7–4.0)
MCHC: 34.1 g/dL (ref 30.0–36.0)
MCV: 91.7 fl (ref 78.0–100.0)
Monocytes Absolute: 0.3 10*3/uL (ref 0.1–1.0)
Monocytes Relative: 5.8 % (ref 3.0–12.0)
Neutro Abs: 2.7 10*3/uL (ref 1.4–7.7)
Neutrophils Relative %: 53.9 % (ref 43.0–77.0)
Platelets: 294 10*3/uL (ref 150.0–400.0)
RBC: 4.39 Mil/uL (ref 3.87–5.11)
RDW: 13 % (ref 11.5–15.5)
WBC: 4.9 10*3/uL (ref 4.0–10.5)

## 2023-11-04 LAB — LIPID PANEL
Cholesterol: 214 mg/dL — ABNORMAL HIGH (ref 0–200)
HDL: 38.8 mg/dL — ABNORMAL LOW (ref >39.00)
LDL Cholesterol: 144 mg/dL — ABNORMAL HIGH (ref 0–99)
NonHDL: 175.57
Total CHOL/HDL Ratio: 6
Triglycerides: 160 mg/dL — ABNORMAL HIGH (ref 0.0–149.0)
VLDL: 32 mg/dL (ref 0.0–40.0)

## 2023-11-04 LAB — COMPREHENSIVE METABOLIC PANEL WITH GFR
ALT: 20 U/L (ref 0–35)
AST: 15 U/L (ref 0–37)
Albumin: 4.3 g/dL (ref 3.5–5.2)
Alkaline Phosphatase: 36 U/L — ABNORMAL LOW (ref 39–117)
BUN: 13 mg/dL (ref 6–23)
CO2: 28 meq/L (ref 19–32)
Calcium: 9.2 mg/dL (ref 8.4–10.5)
Chloride: 102 meq/L (ref 96–112)
Creatinine, Ser: 0.8 mg/dL (ref 0.40–1.20)
GFR: 86.57 mL/min (ref >60.00)
Glucose, Bld: 99 mg/dL (ref 70–99)
Potassium: 4.1 meq/L (ref 3.5–5.1)
Sodium: 137 meq/L (ref 135–145)
Total Bilirubin: 1.5 mg/dL — ABNORMAL HIGH (ref 0.2–1.2)
Total Protein: 7.4 g/dL (ref 6.0–8.3)

## 2023-11-04 LAB — VITAMIN D 25 HYDROXY (VIT D DEFICIENCY, FRACTURES): VITD: 18.03 ng/mL — ABNORMAL LOW (ref 30.00–100.00)

## 2023-11-04 LAB — HEMOGLOBIN A1C: Hgb A1c MFr Bld: 5.3 % (ref 4.6–6.5)

## 2023-11-04 MED ORDER — VITAMIN D (ERGOCALCIFEROL) 1.25 MG (50000 UNIT) PO CAPS: 50000.0000 [IU] | ORAL_CAPSULE | ORAL | 0 refills | Status: AC

## 2023-11-04 NOTE — Progress Notes (Signed)
 Subjective:    Janice Thomas is a 48 y.o. female and is here for a comprehensive physical exam.  HPI  There are no preventive care reminders to display for this patient.  Acute Concerns: No acute concerns reported today.  Chronic Issues: Rheumatoid arthritis She notes Simponi is working well for her  Pt reports no concerns over any of her current medications and would like to update her blood work.  Health Maintenance: Immunizations -- UTD Colonoscopy -- Last done 05/19/2020, results show polyp. 10 year repeat recommended, next due 05/19/2030. Mammogram -- Last done 08/21/2023, results show no mammographic evidence of malignancy. 1 year repeat recommended, next due 08/19/2024. PAP -- Last done 07/27/2019, results were NILM.  Bone Density -- N/a Diet -- Overall well-balanced diet. Exercise -- Pt now works at [employer] and walks a lot.  Sleep habits -- No concerns reported today. Mood -- Stable.  UTD with dentist? - UTD UTD with eye doctor? - UTD  Weight history: Wt Readings from Last 10 Encounters:  11/04/23 177 lb 6.1 oz (80.5 kg)  10/30/22 171 lb (77.6 kg)  10/22/21 168 lb (76.2 kg)  10/02/21 171 lb (77.6 kg)  09/20/21 170 lb (77.1 kg)  06/27/20 165 lb 4 oz (75 kg)  05/19/20 166 lb (75.3 kg)  04/28/20 166 lb (75.3 kg)  04/05/20 168 lb 6.1 oz (76.4 kg)  11/24/19 164 lb (74.4 kg)   Body mass index is 29.98 kg/m. Patient's last menstrual period was 09/28/2019.  Alcohol use:  reports no history of alcohol use.  Tobacco use:  Tobacco Use: Low Risk  (11/04/2023)   Patient History    Smoking Tobacco Use: Never    Smokeless Tobacco Use: Never    Passive Exposure: Not on file   Eligible for lung cancer screening? no     11/04/2023    8:00 AM  Depression screen PHQ 2/9  Decreased Interest 0  Down, Depressed, Hopeless 0  PHQ - 2 Score 0     Other providers/specialists: Patient Care Team: Job Lukes, [place] as PCP - General (Physician Assistant) Jannis Kate Norris, MD as Consulting Physician (Obstetrics and Gynecology)    PMHx, SurgHx, SocialHx, Medications, and Allergies were reviewed in the Visit Navigator and updated as appropriate.   Past Medical History:  Diagnosis Date   Abnormal Pap smear of cervix 04/2010   LGSIL   Anemia    Arthritis    Hyperlipidemia    Hypertension    Menometrorrhagia 10/27/2018   Vaginal delivery 1998, 2003     Past Surgical History:  Procedure Laterality Date   ABDOMINAL HYSTERECTOMY     COLPOSCOPY  05/2010   mild dysplasia CIN1, HPV effect   CYSTOSCOPY N/A 10/26/2019   Procedure: CYSTOSCOPY;  Surgeon: Jannis Kate Norris, MD;  Location: [facility];  Service: Gynecology;  Laterality: N/A;   DILATION AND CURETTAGE OF UTERUS     FOOT SURGERY  04/13/2020   TOTAL LAPAROSCOPIC HYSTERECTOMY WITH SALPINGECTOMY N/A 10/26/2019   Procedure: TOTAL LAPAROSCOPIC HYSTERECTOMY WITH SALPINGECTOMY;  Surgeon: Jannis Kate Norris, MD;  Location: [facility];  Service: Gynecology;  Laterality: N/A;  to follow first case     Family History  Problem Relation Age of Onset   Thyroid disease Mother    Hypertension Mother    Diabetes Mother    Heart attack Mother    Arthritis Mother    Pulmonary embolism Father    Sickle cell anemia Sister    Deep  vein thrombosis Sister    Cancer Neg Hx    Colon cancer Neg Hx    Colon polyps Neg Hx    Esophageal cancer Neg Hx    Stomach cancer Neg Hx    Rectal cancer Neg Hx     Social History   Tobacco Use   Smoking status: Never   Smokeless tobacco: Never  Vaping Use   Vaping status: Never Used  Substance Use Topics   Alcohol use: No   Drug use: No    Review of Systems:   Review of Systems  Constitutional:  Negative for chills, fever, malaise/fatigue and weight loss.  HENT:  Negative for hearing loss, sinus pain and sore throat.   Respiratory:  Negative for cough and hemoptysis.   Cardiovascular:  Negative for chest pain,  palpitations, leg swelling and PND.  Gastrointestinal:  Negative for abdominal pain, constipation, diarrhea, heartburn, nausea and vomiting.  Genitourinary:  Negative for dysuria, frequency and urgency.  Musculoskeletal:  Negative for back pain, myalgias and neck pain.  Skin:  Negative for itching and rash.  Neurological:  Negative for dizziness, tingling, seizures and headaches.  Endo/Heme/Allergies:  Negative for polydipsia.  Psychiatric/Behavioral:  Negative for depression. The patient is not nervous/anxious.      Objective:   BP 130/88 (BP Location: Left Arm, Patient Position: Sitting, Cuff Size: Large)   Pulse 71   Temp 98.2 F (36.8 C) (Temporal)   Ht 5' 4.5 (1.638 m)   Wt 177 lb 6.1 oz (80.5 kg)   LMP 09/28/2019   SpO2 96%   BMI 29.98 kg/m  Body mass index is 29.98 kg/m.   General Appearance:    Alert, cooperative, no distress, appears stated age  Head:    Normocephalic, without obvious abnormality, atraumatic  Eyes:    PERRL, conjunctiva/corneas clear, EOM's intact, fundi    benign, both eyes  Ears:    Normal TM's and external ear canals, both ears  Nose:   Nares normal, septum midline, mucosa normal, no drainage    or sinus tenderness  Throat:   Lips, mucosa, and tongue normal; teeth and gums normal  Neck:   Supple, symmetrical, trachea midline, no adenopathy;    thyroid:  no enlargement/tenderness/nodules; no carotid   bruit or JVD  Back:     Symmetric, no curvature, ROM normal, no CVA tenderness  Lungs:     Clear to auscultation bilaterally, respirations unlabored  Chest Wall:    No tenderness or deformity   Heart:    Regular rate and rhythm, S1 and S2 normal, no murmur, rub or gallop  Breast Exam:    Deferred  Abdomen:     Soft, non-tender, bowel sounds active all four quadrants,    no masses, no organomegaly  Genitalia:    Deferred  Extremities:   Extremities normal, atraumatic, no cyanosis or edema  Pulses:   2+ and symmetric all extremities  Skin:   Skin  color, texture, turgor normal, no rashes or lesions  Lymph nodes:   Cervical, supraclavicular, and axillary nodes normal  Neurologic:   CNII-XII intact, normal strength, sensation and reflexes    throughout    Assessment/Plan:   Routine physical examination Today patient counseled on age appropriate routine health concerns for screening and prevention, each reviewed and up to date or declined. Immunizations reviewed and up to date or declined. Labs ordered and reviewed. Risk factors for depression reviewed and negative. Hearing function and visual acuity are intact. ADLs screened and addressed as needed.  Functional ability and level of safety reviewed and appropriate. Education, counseling and referrals performed based on assessed risks today. Patient provided with a copy of personalized plan for preventive services.  Vitamin D  deficiency Update and provide recommendations   Overweight Continue efforts at healthy lifestyle  Hyperlipidemia, unspecified hyperlipidemia type Update lipid panel and provide recommendations   Elevated glucose add Hemoglobin A1c and provide recommendations   Rheumatoid arthritis of multiple sites with negative rheumatoid factor (HCC) Continue close management with rheumatology   I, Lavern Simmers, acting as a Neurosurgeon for [employer], [place]., have documented all relevant documentation on the behalf of Lucie Buttner, [place], as directed by  Lucie Buttner, PA while in the presence of Lucie Buttner, [place].  I, Lavern KATHEE Simmers, have reviewed all documentation for this visit. The documentation on 11/04/23 for the exam, diagnosis, procedures, and orders are all accurate and complete.  Lucie Buttner, PA-C [facility]

## 2023-11-04 NOTE — Patient Instructions (Signed)
 It was great to see you!  Please go to the lab for blood work.   Our office will call you with your results unless you have chosen to receive results via MyChart.  If your blood work is normal we will follow-up each year for physicals and as scheduled for chronic medical problems.  If anything is abnormal we will treat accordingly and get you in for a follow-up.  Take care,  Lelon Mast

## 2024-01-25 ENCOUNTER — Other Ambulatory Visit: Payer: Self-pay | Admitting: Physician Assistant

## 2024-11-05 ENCOUNTER — Encounter: Admitting: Physician Assistant
